# Patient Record
Sex: Female | Born: 1960
Health system: Southern US, Community
[De-identification: ages and names within clinical notes are randomized; demographics above are authoritative.]

## PROBLEM LIST (undated history)

## (undated) DIAGNOSIS — E119 Type 2 diabetes mellitus without complications: Secondary | ICD-10-CM

## (undated) DIAGNOSIS — N200 Calculus of kidney: Secondary | ICD-10-CM

## (undated) DIAGNOSIS — B019 Varicella without complication: Secondary | ICD-10-CM

## (undated) DIAGNOSIS — Z972 Presence of dental prosthetic device (complete) (partial): Secondary | ICD-10-CM

## (undated) HISTORY — DX: Calculus of kidney: N20.0

## (undated) HISTORY — PX: CATARACT EXTRACTION, BILATERAL: SHX1313

## (undated) HISTORY — DX: Varicella without complication: B01.9

## (undated) HISTORY — PX: INNER EAR SURGERY: SHX679

## (undated) HISTORY — DX: Type 2 diabetes mellitus without complications: E11.9

## (undated) HISTORY — PX: BREAST SURGERY: SHX581

---

## 1980-06-01 HISTORY — PX: BREAST EXCISIONAL BIOPSY: SUR124

## 1981-06-01 HISTORY — PX: BREAST BIOPSY: SHX20

## 2005-02-23 ENCOUNTER — Ambulatory Visit: Payer: Self-pay

## 2005-02-26 ENCOUNTER — Other Ambulatory Visit: Payer: Self-pay

## 2005-03-02 ENCOUNTER — Ambulatory Visit: Payer: Self-pay | Admitting: Unknown Physician Specialty

## 2008-04-01 ENCOUNTER — Emergency Department: Payer: Self-pay | Admitting: Emergency Medicine

## 2008-05-29 ENCOUNTER — Encounter: Payer: Self-pay | Admitting: Sports Medicine

## 2008-06-01 ENCOUNTER — Encounter: Payer: Self-pay | Admitting: Sports Medicine

## 2008-07-02 ENCOUNTER — Encounter: Payer: Self-pay | Admitting: Sports Medicine

## 2008-07-31 ENCOUNTER — Ambulatory Visit: Payer: Self-pay | Admitting: General Practice

## 2008-09-03 ENCOUNTER — Ambulatory Visit: Payer: Self-pay | Admitting: Family Medicine

## 2008-09-13 ENCOUNTER — Ambulatory Visit: Payer: Self-pay | Admitting: Family Medicine

## 2008-10-29 ENCOUNTER — Ambulatory Visit: Payer: Self-pay | Admitting: Family Medicine

## 2008-11-02 ENCOUNTER — Ambulatory Visit: Payer: Self-pay | Admitting: Family Medicine

## 2009-11-25 ENCOUNTER — Ambulatory Visit: Payer: Self-pay | Admitting: Family Medicine

## 2010-08-12 ENCOUNTER — Ambulatory Visit: Payer: Self-pay | Admitting: Family Medicine

## 2010-11-27 ENCOUNTER — Ambulatory Visit: Payer: Self-pay | Admitting: Family Medicine

## 2012-05-16 ENCOUNTER — Ambulatory Visit: Payer: Self-pay

## 2012-08-10 ENCOUNTER — Emergency Department: Payer: Self-pay | Admitting: Internal Medicine

## 2012-08-10 LAB — BASIC METABOLIC PANEL
Anion Gap: 4 — ABNORMAL LOW (ref 7–16)
Calcium, Total: 8.4 mg/dL — ABNORMAL LOW (ref 8.5–10.1)
Chloride: 107 mmol/L (ref 98–107)
Co2: 26 mmol/L (ref 21–32)
Creatinine: 0.72 mg/dL (ref 0.60–1.30)
EGFR (Non-African Amer.): >60
Osmolality: 282 (ref 275–301)

## 2012-08-10 LAB — CBC
HGB: 14.5 g/dL (ref 12.0–16.0)
MCH: 30.9 pg (ref 26.0–34.0)
MCHC: 35.3 g/dL (ref 32.0–36.0)
MCV: 88 fL (ref 80–100)
Platelet: 216 10*3/uL (ref 150–440)
RBC: 4.7 10*6/uL (ref 3.80–5.20)
RDW: 12.9 % (ref 11.5–14.5)

## 2012-08-10 LAB — URINALYSIS, COMPLETE
Bilirubin,UR: NEGATIVE
Glucose,UR: >=500 mg/dL (ref 0–75)
Nitrite: NEGATIVE
Protein: NEGATIVE
RBC,UR: 27 /HPF (ref 0–5)
Specific Gravity: 1.023 (ref 1.003–1.030)
Squamous Epithelial: <1

## 2012-08-23 ENCOUNTER — Ambulatory Visit: Payer: Self-pay | Admitting: Urology

## 2012-09-01 ENCOUNTER — Ambulatory Visit: Payer: Self-pay | Admitting: Urology

## 2012-09-15 ENCOUNTER — Ambulatory Visit: Payer: Self-pay | Admitting: Urology

## 2012-11-14 ENCOUNTER — Ambulatory Visit: Payer: Self-pay | Admitting: Gastroenterology

## 2012-12-12 ENCOUNTER — Ambulatory Visit: Payer: Self-pay | Admitting: Family Medicine

## 2012-12-12 LAB — LIPID PANEL
Cholesterol: 85 mg/dL (ref 0–200)
HDL Cholesterol: 58 mg/dL (ref 40–60)
Ldl Cholesterol, Calc: 23 mg/dL (ref 0–100)
VLDL Cholesterol, Calc: 4 mg/dL — ABNORMAL LOW (ref 5–40)

## 2012-12-12 LAB — BASIC METABOLIC PANEL
BUN: 9 mg/dL (ref 7–18)
Chloride: 102 mmol/L (ref 98–107)
Co2: 30 mmol/L (ref 21–32)
Creatinine: 0.63 mg/dL (ref 0.60–1.30)
EGFR (African American): >60
Glucose: 209 mg/dL — ABNORMAL HIGH (ref 65–99)
Osmolality: 281 (ref 275–301)
Sodium: 138 mmol/L (ref 136–145)

## 2016-09-07 ENCOUNTER — Ambulatory Visit: Payer: Self-pay | Admitting: Family

## 2016-09-17 ENCOUNTER — Encounter: Payer: Self-pay | Admitting: Family

## 2016-09-17 ENCOUNTER — Ambulatory Visit (INDEPENDENT_AMBULATORY_CARE_PROVIDER_SITE_OTHER): Payer: 59 | Admitting: Family

## 2016-09-17 VITALS — BP 122/80 | HR 88 | Temp 98.4°F | Ht 61.0 in | Wt 128.0 lb

## 2016-09-17 DIAGNOSIS — Z0000 Encounter for general adult medical examination without abnormal findings: Secondary | ICD-10-CM

## 2016-09-17 DIAGNOSIS — E119 Type 2 diabetes mellitus without complications: Secondary | ICD-10-CM

## 2016-09-17 DIAGNOSIS — E1129 Type 2 diabetes mellitus with other diabetic kidney complication: Secondary | ICD-10-CM | POA: Insufficient documentation

## 2016-09-17 MED ORDER — PNEUMOCOCCAL VAC POLYVALENT 25 MCG/0.5ML IJ INJ
0.5000 mL | INJECTION | Freq: Once | INTRAMUSCULAR | Status: AC
  Administered 2016-09-17: 0.5 mL via INTRAMUSCULAR

## 2016-09-17 NOTE — Patient Instructions (Addendum)
Fasting labs tomorrow.   We placed a referral. Mammogram this year. I asked that you call one the below locations and schedule this when it is convenient for you.   If you have dense breasts, you may ask for 3D mammogram over the traditional 2D mammogram as new evidence suggest 3D is superior. Please note that NOT all insurance companies cover 3D and you may have to pay a higher copay. You may call your insurance company to further clarify your benefits.   Options for [place]  [place], [place]  * [facility] [place], [place] * Note if you ask for 3D mammogram at this location, you must request [place], [place] location*

## 2016-09-17 NOTE — Assessment & Plan Note (Addendum)
Pending a1c. Eye appt scheduled.

## 2016-09-17 NOTE — Assessment & Plan Note (Addendum)
CPE labs; will return for CPE and pap. Mammogram ordered and patient understands to schedule.

## 2016-09-17 NOTE — Progress Notes (Signed)
Pre visit review using our clinic review tool, if applicable. No additional management support is needed unless otherwise documented below in the visit note. 

## 2016-09-17 NOTE — Progress Notes (Signed)
Subjective:    Patient ID: Rachael Weeks, female    DOB: Apr 27, 1961, 56 y.o.   MRN: [id number]  CC: Rachael Weeks is a 56 y.o. female who presents today to establish care.    HPI: Hasn't  had prior PCP.   DM- Controlling with diet. Checks BS at home, averages 150-180. Occasonal tingling from fingers.   Eye appointment scheduled.       HISTORY:  Past Medical History:  Diagnosis Date  . Chicken pox   . Diabetes mellitus without complication ([place])   . Kidney stones    Past Surgical History:  Procedure Laterality Date  . BREAST SURGERY    . INNER EAR SURGERY     Family History  Problem Relation Age of Onset  . Hypertension Mother   . Cancer Father     Prostate  . Hyperlipidemia Father   . Stroke Father   . Hypertension Father   . Diabetes Father   . Diabetes Sister   . Hyperlipidemia Brother   . Stroke Brother   . Hypertension Brother   . Diabetes Brother   . Diabetes Paternal Grandfather   . Breast cancer Paternal Aunt 62  . Colon cancer Neg Hx     Allergies: Patient has no allergy information on record. No current outpatient prescriptions on file prior to visit.   No current facility-administered medications on file prior to visit.     Social History  Substance Use Topics  . Smoking status: Former Smoker    Quit date: 09/08/2008  . Smokeless tobacco: Never Used  . Alcohol use No    Review of Systems  Constitutional: Negative for chills and fever.  Eyes: Negative for visual disturbance.  Respiratory: Negative for cough.   Cardiovascular: Negative for chest pain and palpitations.  Gastrointestinal: Negative for nausea and vomiting.  Endocrine: Negative for polydipsia and polyuria.      Objective:    BP 122/80   Pulse 88   Temp 98.4 F (36.9 C) (Oral)   Ht 5\' 1"  (1.549 m)   Wt 128 lb (58.1 kg)   SpO2 97%   BMI 24.19 kg/m  BP Readings from Last 3 Encounters:  09/17/16 122/80   Wt Readings from Last 3 Encounters:  09/17/16 128 lb (58.1  kg)    Physical Exam  Constitutional: She appears well-developed and well-nourished.  Eyes: Conjunctivae are normal.  Cardiovascular: Normal rate, regular rhythm, normal heart sounds and normal pulses.   Pulmonary/Chest: Effort normal and breath sounds normal. She has no wheezes. She has no rhonchi. She has no rales.  Neurological: She is alert.  Skin: Skin is warm and dry.  Psychiatric: She has a normal mood and affect. Her speech is normal and behavior is normal. Thought content normal.  Vitals reviewed.      Assessment & Plan:   Problem List Items Addressed This Visit      Endocrine   Diabetes mellitus without complication ([place]) - Primary    Pending a1c. Eye appt scheduled.       Relevant Medications   pneumococcal 23 valent vaccine (PNU-IMMUNE) injection 0.5 mL (Completed)   Other Relevant Orders   Hemoglobin A1c (Completed)     Other   Encounter for medical examination to establish care    CPE labs; will return for CPE and pap. Mammogram ordered and patient understands to schedule.       Relevant Orders   CBC with Differential/Platelet (Completed)   Comprehensive metabolic panel (Completed)  Hepatitis C antibody (Completed)   HIV antibody (Completed)   Lipid panel (Completed)   TSH (Completed)   VITAMIN D 25 Hydroxy (Vit-D Deficiency, Fractures) (Completed)   MM DIGITAL SCREENING BILATERAL       We administered pneumococcal 23 valent vaccine.   Meds ordered this encounter  Medications  . pneumococcal 23 valent vaccine (PNU-IMMUNE) injection 0.5 mL    Return precautions given.   Risks, benefits, and alternatives of the medications and treatment plan prescribed today were discussed, and patient expressed understanding.   Education regarding symptom management and diagnosis given to patient on AVS.  Continue to follow with Mable Paris, FNP for routine health maintenance.   Rachael Weeks and I agreed with plan.   Mable Paris, FNP

## 2016-09-18 ENCOUNTER — Other Ambulatory Visit (INDEPENDENT_AMBULATORY_CARE_PROVIDER_SITE_OTHER): Payer: 59

## 2016-09-18 DIAGNOSIS — Z0000 Encounter for general adult medical examination without abnormal findings: Secondary | ICD-10-CM

## 2016-09-18 DIAGNOSIS — E119 Type 2 diabetes mellitus without complications: Secondary | ICD-10-CM | POA: Diagnosis not present

## 2016-09-18 LAB — CBC WITH DIFFERENTIAL/PLATELET
BASOS PCT: 0.4 % (ref 0.0–3.0)
Basophils Absolute: 0 10*3/uL (ref 0.0–0.1)
EOS ABS: 0.1 10*3/uL (ref 0.0–0.7)
EOS PCT: 1.8 % (ref 0.0–5.0)
HEMATOCRIT: 47 % — AB (ref 36.0–46.0)
HEMOGLOBIN: 16.1 g/dL — AB (ref 12.0–15.0)
LYMPHS PCT: 20.8 % (ref 12.0–46.0)
Lymphs Abs: 1.7 10*3/uL (ref 0.7–4.0)
MCHC: 34.2 g/dL (ref 30.0–36.0)
MCV: 87.7 fl (ref 78.0–100.0)
MONOS PCT: 8.9 % (ref 3.0–12.0)
Monocytes Absolute: 0.7 10*3/uL (ref 0.1–1.0)
Neutro Abs: 5.5 10*3/uL (ref 1.4–7.7)
Neutrophils Relative %: 68.1 % (ref 43.0–77.0)
Platelets: 199 10*3/uL (ref 150.0–400.0)
RBC: 5.37 Mil/uL — ABNORMAL HIGH (ref 3.87–5.11)
RDW: 12.6 % (ref 11.5–15.5)
WBC: 8.1 10*3/uL (ref 4.0–10.5)

## 2016-09-18 LAB — COMPREHENSIVE METABOLIC PANEL
ALBUMIN: 4.4 g/dL (ref 3.5–5.2)
ALT: 22 U/L (ref 0–35)
AST: 16 U/L (ref 0–37)
Alkaline Phosphatase: 89 U/L (ref 39–117)
BILIRUBIN TOTAL: 0.8 mg/dL (ref 0.2–1.2)
BUN: 12 mg/dL (ref 6–23)
CALCIUM: 9.3 mg/dL (ref 8.4–10.5)
CHLORIDE: 100 meq/L (ref 96–112)
CO2: 27 mEq/L (ref 19–32)
CREATININE: 0.54 mg/dL (ref 0.40–1.20)
GFR: 124.42 mL/min (ref >60.00)
Glucose, Bld: 255 mg/dL — ABNORMAL HIGH (ref 70–99)
Potassium: 3.9 mEq/L (ref 3.5–5.1)
Sodium: 135 mEq/L (ref 135–145)
Total Protein: 6.9 g/dL (ref 6.0–8.3)

## 2016-09-18 LAB — LIPID PANEL
Cholesterol: 81 mg/dL (ref 0–200)
HDL: 56.4 mg/dL (ref >39.00)
LDL CALC: 21 mg/dL (ref 0–99)
NONHDL: 24.36
Total CHOL/HDL Ratio: 1
Triglycerides: 19 mg/dL (ref 0.0–149.0)
VLDL: 3.8 mg/dL (ref 0.0–40.0)

## 2016-09-18 LAB — VITAMIN D 25 HYDROXY (VIT D DEFICIENCY, FRACTURES): VITD: 17.08 ng/mL — ABNORMAL LOW (ref 30.00–100.00)

## 2016-09-18 LAB — TSH: TSH: 1.22 u[IU]/mL (ref 0.35–4.50)

## 2016-09-18 LAB — HEMOGLOBIN A1C: HEMOGLOBIN A1C: 12 % — AB (ref 4.6–6.5)

## 2016-09-19 LAB — HIV ANTIBODY (ROUTINE TESTING W REFLEX): HIV 1&2 Ab, 4th Generation: NONREACTIVE

## 2016-09-19 LAB — HEPATITIS C ANTIBODY: HCV AB: NEGATIVE

## 2016-09-21 NOTE — Progress Notes (Signed)
Left detailed message for patient to look at [facility].  If patient had questions I instructed patient to return call back.

## 2016-09-22 ENCOUNTER — Encounter: Payer: Self-pay | Admitting: Family

## 2016-09-24 ENCOUNTER — Ambulatory Visit (INDEPENDENT_AMBULATORY_CARE_PROVIDER_SITE_OTHER): Payer: 59 | Admitting: Family

## 2016-09-24 ENCOUNTER — Encounter: Payer: Self-pay | Admitting: Family

## 2016-09-24 VITALS — BP 130/78 | HR 84 | Temp 98.3°F | Ht 61.0 in | Wt 129.2 lb

## 2016-09-24 DIAGNOSIS — E119 Type 2 diabetes mellitus without complications: Secondary | ICD-10-CM | POA: Diagnosis not present

## 2016-09-24 MED ORDER — BD LANCET DEVICE MISC: 1.0000 "application " | Freq: Four times a day (QID) | 3 refills | Status: AC

## 2016-09-24 MED ORDER — GLUCOSE BLOOD VI STRP: 1.0000 | ORAL_STRIP | Freq: Four times a day (QID) | 3 refills | Status: DC

## 2016-09-24 MED ORDER — METFORMIN HCL ER 500 MG PO TB24: ORAL_TABLET | ORAL | 3 refills | Status: DC

## 2016-09-24 NOTE — Assessment & Plan Note (Signed)
Start metformin. Once have blood glucose readings, we will start lantus. Follow up 3 months.

## 2016-09-24 NOTE — Progress Notes (Signed)
Pre visit review using our clinic review tool, if applicable. No additional management support is needed unless otherwise documented below in the visit note. 

## 2016-09-24 NOTE — Progress Notes (Signed)
Subjective:    Patient ID: Rachael Weeks, female    DOB: 07/20/1960, 56 y.o.   MRN: [id number]  CC: Rachael Weeks is a 56 y.o. female who presents today for follow up.   HPI: DM- No medications at this time. Endorses 'some increased urination.' No polyphagia, polydipsia, vision changes. Overall, feels well     HISTORY:  Past Medical History:  Diagnosis Date  . Chicken pox   . Diabetes mellitus without complication ([place])   . Kidney stones    Past Surgical History:  Procedure Laterality Date  . BREAST SURGERY    . INNER EAR SURGERY     Family History  Problem Relation Age of Onset  . Hypertension Mother   . Cancer Father     Prostate  . Hyperlipidemia Father   . Stroke Father   . Hypertension Father   . Diabetes Father   . Diabetes Sister   . Hyperlipidemia Brother   . Stroke Brother   . Hypertension Brother   . Diabetes Brother   . Diabetes Paternal Grandfather   . Breast cancer Paternal Aunt 70  . Colon cancer Neg Hx     Allergies: Patient has no allergy information on record. No current outpatient prescriptions on file prior to visit.   No current facility-administered medications on file prior to visit.     Social History  Substance Use Topics  . Smoking status: Former Smoker    Quit date: 09/08/2008  . Smokeless tobacco: Never Used  . Alcohol use No    Review of Systems  Constitutional: Negative for chills and fever.  Respiratory: Negative for cough.   Cardiovascular: Negative for chest pain and palpitations.  Gastrointestinal: Negative for nausea and vomiting.  Endocrine: Positive for polyuria. Negative for polydipsia and polyphagia.      Objective:    BP 130/78   Pulse 84   Temp 98.3 F (36.8 C) (Oral)   Ht 5\' 1"  (1.549 m)   Wt 129 lb 3.2 oz (58.6 kg)   SpO2 98%   BMI 24.41 kg/m  BP Readings from Last 3 Encounters:  09/24/16 130/78  09/17/16 122/80   Wt Readings from Last 3 Encounters:  09/24/16 129 lb 3.2 oz (58.6 kg)  09/17/16  128 lb (58.1 kg)    Physical Exam  Constitutional: She appears well-developed and well-nourished.  Eyes: Conjunctivae are normal.  Cardiovascular: Normal rate, regular rhythm, normal heart sounds and normal pulses.   Pulmonary/Chest: Effort normal and breath sounds normal. She has no wheezes. She has no rhonchi. She has no rales.  Neurological: She is alert.  Skin: Skin is warm and dry.  Psychiatric: She has a normal mood and affect. Her speech is normal and behavior is normal. Thought content normal.  Vitals reviewed.      Assessment & Plan:   Problem List Items Addressed This Visit      Endocrine   Diabetes mellitus without complication ([place]) - Primary    Start metformin. Once have blood glucose readings, we will start lantus. Follow up 3 months.       Relevant Medications   metFORMIN (GLUCOPHAGE XR) 500 MG 24 hr tablet   glucose blood test strip   Lancet Devices (B-D LANCET DEVICE) MISC       I am having Ms. Schooler start on metFORMIN, glucose blood, and B-D LANCET DEVICE.   Meds ordered this encounter  Medications  . metFORMIN (GLUCOPHAGE XR) 500 MG 24 hr tablet    Sig:  Start 500mg  PO qpm.    Dispense:  90 tablet    Refill:  3    Order Specific Question:   Supervising Provider    Answer:   Deborra Medina L [[id number]]  . glucose blood test strip    Sig: 1 each by Other route 4 (four) times daily. Use to test blood sugar twice daily.    Dispense:  100 each    Refill:  3    Order Specific Question:   Supervising Provider    Answer:   Deborra Medina L [[id number]]  . Lancet Devices (B-D LANCET DEVICE) MISC    Sig: Inject 1 application into the skin 4 (four) times daily.    Dispense:  100 each    Refill:  3    Order Specific Question:   Supervising Provider    Answer:   Crecencio Mc [[id number]]    Return precautions given.   Risks, benefits, and alternatives of the medications and treatment plan prescribed today were discussed, and patient expressed understanding.    Education regarding symptom management and diagnosis given to patient on AVS.  Continue to follow with Mable Paris, FNP for routine health maintenance.   Romie Levee Babinski and I agreed with plan.   Mable Paris, FNP

## 2016-09-24 NOTE — Patient Instructions (Addendum)
Please ensure eye exam yearly  Start metformin 500 mg once daily at night. After one week, may increase to 1000 mg at night once per day.   Will likely get back to 1000mg  twice daily but we will start slow to ensure you tolerate.   Please monitor blood sugars as discussed and send them to me so we can start lantus.    Follow up 3 months

## 2016-10-06 ENCOUNTER — Encounter: Payer: Self-pay | Admitting: Family

## 2016-10-06 DIAGNOSIS — E119 Type 2 diabetes mellitus without complications: Secondary | ICD-10-CM

## 2016-10-08 MED ORDER — INSULIN NPH (HUMAN) (ISOPHANE) 100 UNIT/ML ~~LOC~~ SUSP: 10.0000 [IU] | Freq: Every day | SUBCUTANEOUS | 2 refills | Status: DC

## 2016-10-08 NOTE — Telephone Encounter (Signed)
Call pt  Based on sugars , I have started ONCE daily at bedtime NPH insulin. [facility]  We will start with 10 units  Monitor for signs of hypoglycemia  Please continue blood sugar log -- I would like fasting morning, fasting lunch,  Fasting dinner and bedtime blood sugars so we can ensure on safe dose.   I know that is a lot of sticks for now however I want to get DM under control and then we can cut back on sticks.Marland Kitchen

## 2016-10-15 DIAGNOSIS — E119 Type 2 diabetes mellitus without complications: Secondary | ICD-10-CM | POA: Diagnosis not present

## 2016-10-15 DIAGNOSIS — H524 Presbyopia: Secondary | ICD-10-CM | POA: Diagnosis not present

## 2016-10-15 DIAGNOSIS — H52223 Regular astigmatism, bilateral: Secondary | ICD-10-CM | POA: Diagnosis not present

## 2016-10-15 DIAGNOSIS — H3561 Retinal hemorrhage, right eye: Secondary | ICD-10-CM | POA: Diagnosis not present

## 2016-10-15 DIAGNOSIS — H5213 Myopia, bilateral: Secondary | ICD-10-CM | POA: Diagnosis not present

## 2016-10-21 NOTE — Telephone Encounter (Signed)
Was pt called with information in prior note 5/10 to brock  Cannot tell and want to ensure we addressed

## 2016-11-02 ENCOUNTER — Ambulatory Visit
Admission: RE | Admit: 2016-11-02 | Discharge: 2016-11-02 | Disposition: A | Discharge status: Home / Self Care | Payer: 59 | Source: Ambulatory Visit | Attending: Family | Admitting: Family

## 2016-11-02 DIAGNOSIS — Z1231 Encounter for screening mammogram for malignant neoplasm of breast: Secondary | ICD-10-CM | POA: Diagnosis not present

## 2016-11-02 DIAGNOSIS — Z0000 Encounter for general adult medical examination without abnormal findings: Secondary | ICD-10-CM

## 2016-11-09 LAB — HM DIABETES EYE EXAM

## 2016-12-17 ENCOUNTER — Other Ambulatory Visit (HOSPITAL_COMMUNITY)
Admission: RE | Admit: 2016-12-17 | Discharge: 2016-12-17 | Disposition: A | Discharge status: Home / Self Care | Payer: 59 | Source: Ambulatory Visit | Attending: Family | Admitting: Family

## 2016-12-17 ENCOUNTER — Ambulatory Visit (INDEPENDENT_AMBULATORY_CARE_PROVIDER_SITE_OTHER): Payer: 59 | Admitting: Family

## 2016-12-17 ENCOUNTER — Encounter: Payer: Self-pay | Admitting: Family

## 2016-12-17 VITALS — BP 120/70 | HR 71 | Temp 98.1°F | Ht 61.0 in | Wt 127.4 lb

## 2016-12-17 DIAGNOSIS — Z0000 Encounter for general adult medical examination without abnormal findings: Secondary | ICD-10-CM | POA: Insufficient documentation

## 2016-12-17 DIAGNOSIS — R229 Localized swelling, mass and lump, unspecified: Secondary | ICD-10-CM

## 2016-12-17 DIAGNOSIS — R21 Rash and other nonspecific skin eruption: Secondary | ICD-10-CM | POA: Insufficient documentation

## 2016-12-17 DIAGNOSIS — L4050 Arthropathic psoriasis, unspecified: Secondary | ICD-10-CM | POA: Insufficient documentation

## 2016-12-17 DIAGNOSIS — R079 Chest pain, unspecified: Secondary | ICD-10-CM | POA: Insufficient documentation

## 2016-12-17 DIAGNOSIS — E119 Type 2 diabetes mellitus without complications: Secondary | ICD-10-CM | POA: Diagnosis not present

## 2016-12-17 DIAGNOSIS — IMO0002 Reserved for concepts with insufficient information to code with codable children: Secondary | ICD-10-CM | POA: Insufficient documentation

## 2016-12-17 LAB — POCT GLYCOSYLATED HEMOGLOBIN (HGB A1C): HEMOGLOBIN A1C: 10.2

## 2016-12-17 NOTE — Addendum Note (Signed)
Addended by: Elpidio Galea T on: 12/17/2016 09:52 AM   Modules accepted: Orders

## 2016-12-17 NOTE — Assessment & Plan Note (Signed)
Appearance consistent with psoriasis. Referral to rheum.

## 2016-12-17 NOTE — Assessment & Plan Note (Signed)
CBE and pap performed. Screening labs prior. Encouraged exercise.

## 2016-12-17 NOTE — Progress Notes (Signed)
Pre visit review using our clinic review tool, if applicable. No additional management support is needed unless otherwise documented below in the visit note. 

## 2016-12-17 NOTE — Assessment & Plan Note (Signed)
Right low back soft tissue mass. Nonfluctuant and no evidence of infection. Suspect lipoma. Pending ultrasound

## 2016-12-17 NOTE — Assessment & Plan Note (Signed)
Compared to EKG 2006 and no acute changes. No ischemia seen today. Due to symptom which is very concerning, referral to cardiology for further evaluation particularly in context of family history of heart disease. She understands to go to ED if any more chest pain.

## 2016-12-17 NOTE — Progress Notes (Signed)
Subjective:    Patient ID: Rachael Weeks, female    DOB: [birth year], 56 y.o.   MRN: [id number]  CC: Rachael Weeks is a 56 y.o. female who presents today for physical exam.    HPI: DM- fasting around 220s. Compliant with metformin.   Knot right low back. Nontender. Hasn't changed in size. 6 months. No purulent discharge, injury. No back pain.   Complains of chest pain, 2-3 episodes 3 months. Endorses diaphoresis during one episode. Lasted 5 minutes. Occurs at rest. Denies numbness or tingling radiating to left arm or jaw, palpitations, dizziness, frequent headaches, changes in vision, or shortness of breath.      Colorectal Cancer Screening: UTD , 2013. Normal. Told to return in 10 years. Dr Allen Norris. Breast Cancer Screening: Mammogram UTD, BI RAD 1. Cervical Cancer Screening: due Bone Health screening/DEXA for 65+: No increased fracture risk. Defer screening at this time. Lung Cancer Screening: Doesn't have 30 year pack year history. Quit 9 years ago. Smoked for 15 years.        Tetanus - utd        Pneumococcal - Completed 23.  Labs: Screening labs done prior.  Exercise: Gets regular exercise.  Alcohol use: None Smoking/tobacco use: former smoker.  Regular dental exams: In need of dental exam. Wears seat belt: Yes. Skin: no skin cancer history; suspect psoriasis on elbows, comes and goes. No joint pain.   HISTORY:  Past Medical History:  Diagnosis Date  . Chicken pox   . Diabetes mellitus without complication ([place])   . Kidney stones     Past Surgical History:  Procedure Laterality Date  . BREAST BIOPSY Right 1983   benign  . BREAST SURGERY    . INNER EAR SURGERY     Family History  Problem Relation Age of Onset  . Hypertension Mother   . Cancer Father        Prostate  . Hyperlipidemia Father   . Stroke Father   . Hypertension Father   . Diabetes Father   . Diabetes Sister   . Hyperlipidemia Brother   . Stroke Brother   . Hypertension Brother   . Diabetes  Brother   . Diabetes Paternal Grandfather   . Breast cancer Paternal Aunt 64  . Colon cancer Neg Hx       ALLERGIES: Patient has no allergy information on record.  Current Outpatient Prescriptions on File Prior to Visit  Medication Sig Dispense Refill  . glucose blood test strip 1 each by Other route 4 (four) times daily. Use to test blood sugar twice daily. 100 each 3  . Lancet Devices (B-D LANCET DEVICE) MISC Inject 1 application into the skin 4 (four) times daily. 100 each 3  . metFORMIN (GLUCOPHAGE XR) 500 MG 24 hr tablet Start 500mg  PO qpm. 90 tablet 3   No current facility-administered medications on file prior to visit.     Social History  Substance Use Topics  . Smoking status: Former Smoker    Quit date: 09/08/2008  . Smokeless tobacco: Never Used  . Alcohol use No    Review of Systems  Constitutional: Negative for chills, fever and unexpected weight change.  HENT: Negative for congestion.   Respiratory: Negative for cough and shortness of breath.   Cardiovascular: Positive for chest pain. Negative for palpitations and leg swelling.  Gastrointestinal: Negative for nausea and vomiting.  Musculoskeletal: Negative for arthralgias and myalgias.  Skin: Positive for rash.  Neurological: Negative for headaches.  Hematological:  Negative for adenopathy.  Psychiatric/Behavioral: Negative for confusion.      Objective:    BP 120/70   Pulse 71   Temp 98.1 F (36.7 C) (Oral)   Ht 5\' 1"  (1.549 m)   Wt 127 lb 6.4 oz (57.8 kg)   SpO2 97%   BMI 24.07 kg/m   BP Readings from Last 3 Encounters:  12/17/16 120/70  09/24/16 130/78  09/17/16 122/80   Wt Readings from Last 3 Encounters:  12/17/16 127 lb 6.4 oz (57.8 kg)  09/24/16 129 lb 3.2 oz (58.6 kg)  09/17/16 128 lb (58.1 kg)    Physical Exam  Constitutional: She appears well-developed and well-nourished.  Eyes: Conjunctivae are normal.  Neck: No thyroid mass and no thyromegaly present.  Cardiovascular: Normal  rate, regular rhythm, normal heart sounds and normal pulses.   Pulmonary/Chest: Effort normal and breath sounds normal. She has no wheezes. She has no rhonchi. She has no rales. Right breast exhibits no inverted nipple, no mass, no nipple discharge, no skin change and no tenderness. Left breast exhibits no inverted nipple, no mass, no nipple discharge, no skin change and no tenderness. Breasts are symmetrical.  No masses or asymmetry appreciated during CBE.  Genitourinary: Uterus is not enlarged, not fixed and not tender. Cervix exhibits no motion tenderness, no discharge and no friability. Right adnexum displays no mass, no tenderness and no fullness. Left adnexum displays no mass, no tenderness and no fullness.  Genitourinary Comments: Pap performed. No CMT. Unable to appreciated ovaries.  Lymphadenopathy:       Head (right side): No submental, no submandibular, no tonsillar, no preauricular, no posterior auricular and no occipital adenopathy present.       Head (left side): No submental, no submandibular, no tonsillar, no preauricular, no posterior auricular and no occipital adenopathy present.       Right cervical: No superficial cervical, no deep cervical and no posterior cervical adenopathy present.      Left cervical: No superficial cervical, no deep cervical and no posterior cervical adenopathy present.    She has no axillary adenopathy.       Right axillary: No pectoral and no lateral adenopathy present.       Left axillary: No pectoral and no lateral adenopathy present. Neurological: She is alert.  Skin: Skin is warm and dry. Lesion noted.     Scaly lesions noted bilateral elbows. No purulent discharge, increase in warmth. No elbow pain.  Well-circumscribed nontender mass approximately 2-3 cm in diameter noted right mid back. Nontender and nonfluctuant.No surrounding erythema or purulent discharge  Psychiatric: She has a normal mood and affect. Her speech is normal and behavior is  normal. Thought content normal.  Vitals reviewed.      Assessment & Plan:   Problem List Items Addressed This Visit      Endocrine   Diabetes mellitus without complication ([facility])    Improved. 10.2. Referral to nutrition. Increase in metformin.       Relevant Orders   POCT HgB A1C (Completed)   Referral to Nutrition and Diabetes Services     Musculoskeletal and Integument   Rash    Appearance consistent with psoriasis. Referral to rheum.       Relevant Orders   Ambulatory referral to Rheumatology     Other   Routine physical examination - Primary    CBE and pap performed. Screening labs prior. Encouraged exercise.       Chest pain    Compared to EKG 2006  and no acute changes. No ischemia seen today. Due to symptom which is very concerning, referral to cardiology for further evaluation particularly in context of family history of heart disease. She understands to go to ED if any more chest pain.       Relevant Orders   Ambulatory referral to Cardiology   EKG 12-Lead (Completed)   Mass    Right low back soft tissue mass. Nonfluctuant and no evidence of infection. Suspect lipoma. Pending ultrasound      Relevant Orders   [place] Misc Soft Tissue       I have discontinued Ms. Stoltzfus's insulin NPH Human. I am also having her maintain her metFORMIN, glucose blood, and B-D LANCET DEVICE.   No orders of the defined types were placed in this encounter.   Return precautions given.   Risks, benefits, and alternatives of the medications and treatment plan prescribed today were discussed, and patient expressed understanding.   Education regarding symptom management and diagnosis given to patient on AVS.   Continue to follow with Burnard Hawthorne, FNP for routine health maintenance.   Romie Levee Zappia and I agreed with plan.   Mable Paris, FNP

## 2016-12-17 NOTE — Patient Instructions (Addendum)
Increase metformin XR as discussed and as tolerated '500mg'$  total per week. Max daily daily dose of '2000mg'$  XR per day.    Any episodes of chest pain, please go to ED  Health Maintenance, Female Adopting a healthy lifestyle and getting preventive care can go a long way to promote health and wellness. Talk with your health care provider about what schedule of regular examinations is right for you. This is a good chance for you to check in with your provider about disease prevention and staying healthy. In between checkups, there are plenty of things you can do on your own. Experts have done a lot of research about which lifestyle changes and preventive measures are most likely to keep you healthy. Ask your health care provider for more information. Weight and diet Eat a healthy diet  Be sure to include plenty of vegetables, fruits, low-fat dairy products, and lean protein.  Do not eat a lot of foods high in solid fats, added sugars, or salt.  Get regular exercise. This is one of the most important things you can do for your health. ? Most adults should exercise for at least 150 minutes each week. The exercise should increase your heart rate and make you sweat (moderate-intensity exercise). ? Most adults should also do strengthening exercises at least twice a week. This is in addition to the moderate-intensity exercise.  Maintain a healthy weight  Body mass index (BMI) is a measurement that can be used to identify possible weight problems. It estimates body fat based on height and weight. Your health care provider can help determine your BMI and help you achieve or maintain a healthy weight.  For females 4 years of age and older: ? A BMI below 18.5 is considered underweight. ? A BMI of 18.5 to 24.9 is normal. ? A BMI of 25 to 29.9 is considered overweight. ? A BMI of 30 and above is considered obese.  Watch levels of cholesterol and blood lipids  You should start having your blood tested  for lipids and cholesterol at 56 years of age, then have this test every 5 years.  You may need to have your cholesterol levels checked more often if: ? Your lipid or cholesterol levels are high. ? You are older than 56 years of age. ? You are at high risk for heart disease.  Cancer screening Lung Cancer  Lung cancer screening is recommended for adults 37-24 years old who are at high risk for lung cancer because of a history of smoking.  A yearly low-dose CT scan of the lungs is recommended for people who: ? Currently smoke. ? Have quit within the past 15 years. ? Have at least a 30-pack-year history of smoking. A pack year is smoking an average of one pack of cigarettes a day for 1 year.  Yearly screening should continue until it has been 15 years since you quit.  Yearly screening should stop if you develop a health problem that would prevent you from having lung cancer treatment.  Breast Cancer  Practice breast self-awareness. This means understanding how your breasts normally appear and feel.  It also means doing regular breast self-exams. Let your health care provider know about any changes, no matter how small.  If you are in your 20s or 30s, you should have a clinical breast exam (CBE) by a health care provider every 1-3 years as part of a regular health exam.  If you are 43 or older, have a CBE every year. Also  consider having a breast X-ray (mammogram) every year.  If you have a family history of breast cancer, talk to your health care provider about genetic screening.  If you are at high risk for breast cancer, talk to your health care provider about having an MRI and a mammogram every year.  Breast cancer gene (BRCA) assessment is recommended for women who have family members with BRCA-related cancers. BRCA-related cancers include: ? Breast. ? Ovarian. ? Tubal. ? Peritoneal cancers.  Results of the assessment will determine the need for genetic counseling and BRCA1  and BRCA2 testing.  Cervical Cancer Your health care provider may recommend that you be screened regularly for cancer of the pelvic organs (ovaries, uterus, and vagina). This screening involves a pelvic examination, including checking for microscopic changes to the surface of your cervix (Pap test). You may be encouraged to have this screening done every 3 years, beginning at age 11.  For women ages 1-65, health care providers may recommend pelvic exams and Pap testing every 3 years, or they may recommend the Pap and pelvic exam, combined with testing for human papilloma virus (HPV), every 5 years. Some types of HPV increase your risk of cervical cancer. Testing for HPV may also be done on women of any age with unclear Pap test results.  Other health care providers may not recommend any screening for nonpregnant women who are considered low risk for pelvic cancer and who do not have symptoms. Ask your health care provider if a screening pelvic exam is right for you.  If you have had past treatment for cervical cancer or a condition that could lead to cancer, you need Pap tests and screening for cancer for at least 20 years after your treatment. If Pap tests have been discontinued, your risk factors (such as having a new sexual partner) need to be reassessed to determine if screening should resume. Some women have medical problems that increase the chance of getting cervical cancer. In these cases, your health care provider may recommend more frequent screening and Pap tests.  Colorectal Cancer  This type of cancer can be detected and often prevented.  Routine colorectal cancer screening usually begins at 56 years of age and continues through 56 years of age.  Your health care provider may recommend screening at an earlier age if you have risk factors for colon cancer.  Your health care provider may also recommend using home test kits to check for hidden blood in the stool.  A small camera at  the end of a tube can be used to examine your colon directly (sigmoidoscopy or colonoscopy). This is done to check for the earliest forms of colorectal cancer.  Routine screening usually begins at age 46.  Direct examination of the colon should be repeated every 5-10 years through 56 years of age. However, you may need to be screened more often if early forms of precancerous polyps or small growths are found.  Skin Cancer  Check your skin from head to toe regularly.  Tell your health care provider about any new moles or changes in moles, especially if there is a change in a mole's shape or color.  Also tell your health care provider if you have a mole that is larger than the size of a pencil eraser.  Always use sunscreen. Apply sunscreen liberally and repeatedly throughout the day.  Protect yourself by wearing long sleeves, pants, a wide-brimmed hat, and sunglasses whenever you are outside.  Heart disease, diabetes, and high blood  pressure  High blood pressure causes heart disease and increases the risk of stroke. High blood pressure is more likely to develop in: ? People who have blood pressure in the high end of the normal range (130-139/85-89 mm Hg). ? People who are overweight or obese. ? People who are African American.  If you are 18-39 years of age, have your blood pressure checked every 3-5 years. If you are 40 years of age or older, have your blood pressure checked every year. You should have your blood pressure measured twice-once when you are at a hospital or clinic, and once when you are not at a hospital or clinic. Record the average of the two measurements. To check your blood pressure when you are not at a hospital or clinic, you can use: ? An automated blood pressure machine at a pharmacy. ? A home blood pressure monitor.  If you are between 55 years and 79 years old, ask your health care provider if you should take aspirin to prevent strokes.  Have regular diabetes  screenings. This involves taking a blood sample to check your fasting blood sugar level. ? If you are at a normal weight and have a low risk for diabetes, have this test once every three years after 56 years of age. ? If you are overweight and have a high risk for diabetes, consider being tested at a younger age or more often. Preventing infection Hepatitis B  If you have a higher risk for hepatitis B, you should be screened for this virus. You are considered at high risk for hepatitis B if: ? You were born in a country where hepatitis B is common. Ask your health care provider which countries are considered high risk. ? Your parents were born in a high-risk country, and you have not been immunized against hepatitis B (hepatitis B vaccine). ? You have HIV or AIDS. ? You use needles to inject street drugs. ? You live with someone who has hepatitis B. ? You have had sex with someone who has hepatitis B. ? You get hemodialysis treatment. ? You take certain medicines for conditions, including cancer, organ transplantation, and autoimmune conditions.  Hepatitis C  Blood testing is recommended for: ? Everyone born from 1945 through 1965. ? Anyone with known risk factors for hepatitis C.  Sexually transmitted infections (STIs)  You should be screened for sexually transmitted infections (STIs) including gonorrhea and chlamydia if: ? You are sexually active and are younger than 56 years of age. ? You are older than 56 years of age and your health care provider tells you that you are at risk for this type of infection. ? Your sexual activity has changed since you were last screened and you are at an increased risk for chlamydia or gonorrhea. Ask your health care provider if you are at risk.  If you do not have HIV, but are at risk, it may be recommended that you take a prescription medicine daily to prevent HIV infection. This is called pre-exposure prophylaxis (PrEP). You are considered at risk  if: ? You are sexually active and do not regularly use condoms or know the HIV status of your partner(s). ? You take drugs by injection. ? You are sexually active with a partner who has HIV.  Talk with your health care provider about whether you are at high risk of being infected with HIV. If you choose to begin PrEP, you should first be tested for HIV. You should then be tested every   3 months for as long as you are taking PrEP. Pregnancy  If you are premenopausal and you may become pregnant, ask your health care provider about preconception counseling.  If you may become pregnant, take 400 to 800 micrograms (mcg) of folic acid every day.  If you want to prevent pregnancy, talk to your health care provider about birth control (contraception). Osteoporosis and menopause  Osteoporosis is a disease in which the bones lose minerals and strength with aging. This can result in serious bone fractures. Your risk for osteoporosis can be identified using a bone density scan.  If you are 90 years of age or older, or if you are at risk for osteoporosis and fractures, ask your health care provider if you should be screened.  Ask your health care provider whether you should take a calcium or vitamin D supplement to lower your risk for osteoporosis.  Menopause may have certain physical symptoms and risks.  Hormone replacement therapy may reduce some of these symptoms and risks. Talk to your health care provider about whether hormone replacement therapy is right for you. Follow these instructions at home:  Schedule regular health, dental, and eye exams.  Stay current with your immunizations.  Do not use any tobacco products including cigarettes, chewing tobacco, or electronic cigarettes.  If you are pregnant, do not drink alcohol.  If you are breastfeeding, limit how much and how often you drink alcohol.  Limit alcohol intake to no more than 1 drink per day for nonpregnant women. One drink  equals 12 ounces of beer, 5 ounces of wine, or 1 ounces of hard liquor.  Do not use street drugs.  Do not share needles.  Ask your health care provider for help if you need support or information about quitting drugs.  Tell your health care provider if you often feel depressed.  Tell your health care provider if you have ever been abused or do not feel safe at home. This information is not intended to replace advice given to you by your health care provider. Make sure you discuss any questions you have with your health care provider. Document Released: 12/01/2010 Document Revised: 10/24/2015 Document Reviewed: 02/19/2015 Elsevier Interactive Patient Education  [employer].

## 2016-12-17 NOTE — Assessment & Plan Note (Signed)
Improved. 10.2. Referral to nutrition. Increase in metformin.

## 2016-12-18 ENCOUNTER — Other Ambulatory Visit: Payer: Self-pay | Admitting: Family

## 2016-12-18 LAB — CYTOLOGY - PAP
Diagnosis: NEGATIVE
HPV: NOT DETECTED

## 2016-12-21 ENCOUNTER — Other Ambulatory Visit: Payer: Self-pay | Admitting: Family

## 2016-12-21 DIAGNOSIS — M7989 Other specified soft tissue disorders: Secondary | ICD-10-CM

## 2016-12-23 ENCOUNTER — Ambulatory Visit
Admission: RE | Admit: 2016-12-23 | Discharge: 2016-12-23 | Disposition: A | Discharge status: Home / Self Care | Payer: 59 | Source: Ambulatory Visit | Attending: Family | Admitting: Family

## 2016-12-23 ENCOUNTER — Ambulatory Visit: Admission: RE | Admit: 2016-12-23 | Payer: 59 | Source: Ambulatory Visit

## 2016-12-23 DIAGNOSIS — M799 Soft tissue disorder, unspecified: Secondary | ICD-10-CM | POA: Diagnosis not present

## 2016-12-23 DIAGNOSIS — M7989 Other specified soft tissue disorders: Secondary | ICD-10-CM

## 2016-12-23 DIAGNOSIS — R1900 Intra-abdominal and pelvic swelling, mass and lump, unspecified site: Secondary | ICD-10-CM | POA: Diagnosis not present

## 2016-12-29 ENCOUNTER — Encounter: Payer: Self-pay | Admitting: Family

## 2016-12-29 ENCOUNTER — Encounter: Payer: Self-pay | Admitting: Cardiovascular Disease

## 2016-12-29 ENCOUNTER — Ambulatory Visit (INDEPENDENT_AMBULATORY_CARE_PROVIDER_SITE_OTHER): Payer: 59 | Admitting: Cardiovascular Disease

## 2016-12-29 VITALS — BP 118/70 | HR 75 | Ht 61.0 in | Wt 124.5 lb

## 2016-12-29 DIAGNOSIS — R079 Chest pain, unspecified: Secondary | ICD-10-CM

## 2016-12-29 DIAGNOSIS — E1165 Type 2 diabetes mellitus with hyperglycemia: Secondary | ICD-10-CM

## 2016-12-29 DIAGNOSIS — Z87891 Personal history of nicotine dependence: Secondary | ICD-10-CM

## 2016-12-29 DIAGNOSIS — R0602 Shortness of breath: Secondary | ICD-10-CM
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
## 2016-12-29 NOTE — Patient Instructions (Addendum)
Medication Instructions:   No medication changes made  Start vit D daily, at least 3,000 units  Labwork:  No new labs needed  Testing/Procedures:  We will order a CT coronary calcium score There is a one-time fee of $150 due at the time of your procedure Please call [phone] to schedule this at your convenience [address] [place], [place] [address], [place]: It was a pleasure seeing you in the office today. Please call us if you have new issues that need to be addressed before your next appt.  [phone]  Your physician wants you to follow-up in:  as needed  If you need a refill on your cardiac medications before your next appointment, please call your pharmacy.     Coronary Calcium Scan A coronary calcium scan is an imaging test used to look for deposits of calcium and other fatty materials (plaques) in the inner lining of the blood vessels of the heart (coronary arteries). These deposits of calcium and plaques can partly clog and narrow the coronary arteries without producing any symptoms or warning signs. This puts a person at risk for a heart attack. This test can detect these deposits before symptoms develop. Tell a health care provider about:  Any allergies you have.  All medicines you are taking, including vitamins, herbs, eye drops, creams, and over-the-counter medicines.  Any problems you or family members have had with anesthetic medicines.  Any blood disorders you have.  Any surgeries you have had.  Any medical conditions you have.  Whether you are pregnant or may be pregnant. What are the risks? Generally, this is a safe procedure. However, problems may occur, including:  Harm to a pregnant woman and her unborn baby. This test involves the use of radiation. Radiation exposure can be dangerous to a pregnant woman and her unborn baby. If you are pregnant, you generally should not have this procedure done.  Slight increase in the risk of  cancer. This is because of the radiation involved in the test.  What happens before the procedure? No preparation is needed for this procedure. What happens during the procedure?  You will undress and remove any jewelry around your neck or chest.  You will put on a hospital gown.  Sticky electrodes will be placed on your chest. The electrodes will be connected to an electrocardiogram (ECG) machine to record a tracing of the electrical activity of your heart.  A CT scanner will take pictures of your heart. During this time, you will be asked to lie still and hold your breath for 2-3 seconds while a picture of your heart is being taken. The procedure may vary among health care providers and hospitals. What happens after the procedure?  You can get dressed.  You can return to your normal activities.  It is up to you to get the results of your test. Ask your health care provider, or the department that is doing the test, when your results will be ready. Summary  A coronary calcium scan is an imaging test used to look for deposits of calcium and other fatty materials (plaques) in the inner lining of the blood vessels of the heart (coronary arteries).  Generally, this is a safe procedure. Tell your health care provider if you are pregnant or may be pregnant.  No preparation is needed for this procedure.  A CT scanner will take pictures of your heart.  You can return to your normal activities after the scan is done. This information is  not intended to replace advice given to you by your health care provider. Make sure you discuss any questions you have with your health care provider. Document Released: 11/14/2007 Document Revised: 04/06/2016 Document Reviewed: 04/06/2016 Elsevier Interactive Patient Education  2017 [employer].

## 2016-12-31 ENCOUNTER — Other Ambulatory Visit: Payer: Self-pay | Admitting: Family

## 2016-12-31 DIAGNOSIS — N952 Postmenopausal atrophic vaginitis: Secondary | ICD-10-CM

## 2016-12-31 MED ORDER — ESTRADIOL 0.1 MG/GM VA CREA: TOPICAL_CREAM | VAGINAL | 0 refills | Status: DC

## 2017-01-01 ENCOUNTER — Ambulatory Visit (INDEPENDENT_AMBULATORY_CARE_PROVIDER_SITE_OTHER)
Admission: RE | Admit: 2017-01-01 | Discharge: 2017-01-01 | Disposition: A | Discharge status: Home / Self Care | Payer: Self-pay | Source: Ambulatory Visit | Attending: Cardiovascular Disease | Admitting: Cardiovascular Disease

## 2017-01-01 ENCOUNTER — Encounter: Payer: 59 | Attending: Family | Admitting: *Deleted

## 2017-01-01 ENCOUNTER — Encounter: Payer: Self-pay | Admitting: *Deleted

## 2017-01-01 VITALS — BP 110/74 | Ht 60.0 in | Wt 125.3 lb

## 2017-01-01 DIAGNOSIS — R079 Chest pain, unspecified: Secondary | ICD-10-CM

## 2017-01-01 DIAGNOSIS — E119 Type 2 diabetes mellitus without complications: Secondary | ICD-10-CM | POA: Diagnosis not present

## 2017-01-01 DIAGNOSIS — Z713 Dietary counseling and surveillance: Secondary | ICD-10-CM | POA: Diagnosis not present

## 2017-01-01 DIAGNOSIS — E1165 Type 2 diabetes mellitus with hyperglycemia: Secondary | ICD-10-CM

## 2017-01-01 NOTE — Patient Instructions (Addendum)
Check blood sugars 1-2 x day before breakfast or 2 hrs after supper every day Bring blood sugar records to the next appointment  Exercise: Continue walking for 10  minutes   2 days a week and gradually increase to 30 minutes 5 x week  Eat 3 meals day,  1-2 snacks a day Space meals 4-6 hours apart Limit fried foods Complete 3 Day Food Record and bring to next appt  Return for appointments on:  Tuesday January 12, 2017 at 9:00 with Jeannene Patella

## 2017-01-01 NOTE — Progress Notes (Signed)
Diabetes Self-Management Education  Visit Type: First/Initial  Appt. Start Time: 0900 Appt. End Time: 1027  01/01/2017  [name], identified by name and date of birth, is a 56 y.o. female with a diagnosis of Diabetes: Type 2.   ASSESSMENT  Blood pressure 110/74, height 5' (1.524 m), weight 125 lb 4.8 oz (56.8 kg). Body mass index is 24.47 kg/m.      Diabetes Self-Management Education - 01/01/17 1038      Visit Information   Visit Type First/Initial     Initial Visit   Diabetes Type Type 2   Are you currently following a meal plan? Yes   What type of meal plan do you follow? "less fried foods and less potatoes"   Are you taking your medications as prescribed? Yes   Date Diagnosed 8 years ago     Health Coping   How would you rate your overall health? Fair     Psychosocial Assessment   Patient Belief/Attitude about Diabetes Motivated to manage diabetes   Self-care barriers None   Self-management support Doctor's office;Family   Patient Concerns Nutrition/Meal planning;Glycemic Control;Monitoring;Healthy Lifestyle   Special Needs None   Preferred Learning Style Visual;Auditory   Learning Readiness Change in progress   How often do you need to have someone help you when you read instructions, pamphlets, or other written materials from your doctor or pharmacy? 1 - Never   What is the last grade level you completed in school? [employer]     Pre-Education Assessment   Patient understands the diabetes disease and treatment process. Needs Review   Patient understands incorporating nutritional management into lifestyle. Needs Review   Patient undertands incorporating physical activity into lifestyle. Needs Review   Patient understands using medications safely. Needs Review   Patient understands monitoring blood glucose, interpreting and using results Needs Review   Patient understands prevention, detection, and treatment of acute complications. Needs Instruction   Patient understands prevention, detection, and treatment of chronic complications. Needs Review   Patient understands how to develop strategies to address psychosocial issues. Needs Review   Patient understands how to develop strategies to promote health/change behavior. Needs Review     Complications   Last HgB A1C per patient/outside source 10.2 %  12/17/16   How often do you check your blood sugar? 1-2 times/day   Fasting Blood glucose range (mg/dL) >200  Pt reports FBG's range from 220-230's mg/dL.    Have you had a dilated eye exam in the past 12 months? Yes   Have you had a dental exam in the past 12 months? No   Are you checking your feet? Yes   How many days per week are you checking your feet? 7     Dietary Intake   Breakfast cereal and milk; bacon, egg and cheese biscuit with hashbrown   Lunch eats out 4 days [place], [place], [employer]; salads, sandwich, fruit   Snack (afternoon) peanut butter crackers; snack mix   Dinner sandwich - ham, cheese or egg; grilled pork or chicken or beef, zucchini, carrots, tomatoes, beans, peas, corn, potatoes   Beverage(s) water, coffee - black, diet soda     Exercise   Exercise Type Light (walking / raking leaves)   How many days per week to you exercise? 2   How many minutes per day do you exercise? 10   Total minutes per week of exercise 20     Patient Education   Previous Diabetes Education Yes (please comment)  8 years  ago - came to [facility] site for classes   Disease state  Definition of diabetes, type 1 and 2, and the diagnosis of diabetes;Explored patient's options for treatment of their diabetes   Nutrition management  Role of diet in the treatment of diabetes and the relationship between the three main macronutrients and blood glucose level;Food label reading, portion sizes and measuring food.;Reviewed blood glucose goals for pre and post meals and how to evaluate the patients' food intake on their blood glucose level.;Meal timing in  regards to the patients' current diabetes medication.   Physical activity and exercise  Role of exercise on diabetes management, blood pressure control and cardiac health.   Medications Reviewed patients medication for diabetes, action, purpose, timing of dose and side effects.   Monitoring Purpose and frequency of SMBG.;Taught/discussed recording of test results and interpretation of SMBG.;Identified appropriate SMBG and/or A1C goals.   Chronic complications Relationship between chronic complications and blood glucose control   Psychosocial adjustment Identified and addressed patients feelings and concerns about diabetes     Individualized Goals (developed by patient)   Reducing Risk Improve blood sugars Prevent diabetes complications Lead a healthier lifestyle Become more fit     Outcomes   Expected Outcomes Demonstrated interest in learning. Expect positive outcomes   Future DMSE 2 wks      Individualized Plan for Diabetes Self-Management Training:   Learning Objective:  Patient will have a greater understanding of diabetes self-management. Patient education plan is to attend individual and/or group sessions per assessed needs and concerns.   Plan:   Patient Instructions  Check blood sugars 1-2 x day before breakfast or 2 hrs after supper every day Bring blood sugar records to the next appointment Exercise: Continue walking for 10  minutes   2 days a week and gradually increase to 30 minutes 5 x week Eat 3 meals day,  1-2 snacks a day Space meals 4-6 hours apart Limit fried foods Complete 3 Day Food Record and bring to next appt Return for appointments on:  Tuesday January 12, 2017 at 9:00 with [facility] (dietitian)  Expected Outcomes:  Demonstrated interest in learning. Expect positive outcomes  Education material provided:  General Meal Planning Guidelines Simple Meal Plan 3 Day Food Record  If problems or questions, patient to contact team via:  Johny Drilling, RN, [place], CDE  [phone]  Future DSME appointment: 2 wks  January 12, 2017 with the dietitian

## 2017-01-03 ENCOUNTER — Telehealth: Payer: Self-pay | Admitting: Family

## 2017-01-03 NOTE — Telephone Encounter (Signed)
Call pt Reviewed nutrionist note  Her fasting blood sugars are pretty high  Can she come in for a sooner appt?   May need to start another medicaiton in addition to metformin

## 2017-01-04 NOTE — Telephone Encounter (Signed)
Scheduled patient for Thursday @ 0830

## 2017-01-07 ENCOUNTER — Ambulatory Visit (INDEPENDENT_AMBULATORY_CARE_PROVIDER_SITE_OTHER): Payer: 59 | Admitting: Family

## 2017-01-07 ENCOUNTER — Encounter: Payer: Self-pay | Admitting: Family

## 2017-01-07 VITALS — BP 132/74 | HR 85 | Temp 98.1°F | Ht 60.0 in | Wt 126.6 lb

## 2017-01-07 DIAGNOSIS — E119 Type 2 diabetes mellitus without complications: Secondary | ICD-10-CM

## 2017-01-07 MED ORDER — GLIPIZIDE 5 MG PO TABS: 5.0000 mg | ORAL_TABLET | Freq: Every day | ORAL | 1 refills | Status: DC

## 2017-01-07 NOTE — Patient Instructions (Addendum)
start glipizide 5 mg once per day. May increase to 5mg  twice per day if sugars are not coming down.   Also increase metformin to 1000mg  twice per day  Please be very very careful of any low blood sugars as taking metformin and glipizide does increase risk for hypoglycemia Please continue check blood sugars.   The goal of fasting is less than 130.   Postprandial blood sugars goal is less than 160.    Ensure you have follow up in the next 2-3 months to recheck A1c.

## 2017-01-07 NOTE — Progress Notes (Signed)
Subjective:    Patient ID: Rachael Weeks, female    DOB: November 21, 1960, 56 y.o.   MRN: [id number]  CC: Rachael Weeks is a 55 y.o. female who presents today for follow up.   HPI: Feeling well today. No complaints.   DM- Fasting blood sugars am 211. 'low 200's'.  Coming down on metformin has had been 240's. Last a1c 10.2 On metformin 1000mg  QD and 500 mg QD.  Has been on glipizide in the past which helped.        HISTORY:  Past Medical History:  Diagnosis Date  . Chicken pox   . Diabetes mellitus without complication ([place])   . Kidney stones    Past Surgical History:  Procedure Laterality Date  . BREAST BIOPSY Right 1983   benign  . BREAST SURGERY    . INNER EAR SURGERY     Family History  Problem Relation Age of Onset  . Hypertension Mother   . Cancer Father        Prostate  . Hyperlipidemia Father   . Stroke Father   . Hypertension Father   . Diabetes Father   . Diabetes Sister   . Hyperlipidemia Brother   . Stroke Brother   . Hypertension Brother   . Diabetes Brother   . Diabetes Paternal Grandfather   . Breast cancer Paternal Aunt 19  . Diabetes Son   . Diabetes Sister   . Diabetes Brother   . Diabetes Brother   . Colon cancer Neg Hx     Allergies: Patient has no known allergies. Current Outpatient Prescriptions on File Prior to Visit  Medication Sig Dispense Refill  . estradiol (ESTRACE) 0.1 MG/GM vaginal cream 0.5 g intravaginally 1-3 times per week. 42.5 g 0  . glucose blood test strip 1 each by Other route 4 (four) times daily. Use to test blood sugar twice daily. 100 each 3  . Lancet Devices (B-D LANCET DEVICE) MISC Inject 1 application into the skin 4 (four) times daily. 100 each 3  . metFORMIN (GLUCOPHAGE XR) 500 MG 24 hr tablet Start 500mg  PO qpm. (Patient taking differently: Take 1,000 mg by mouth daily with supper. Start 500mg  PO qpm.) 90 tablet 3   No current facility-administered medications on file prior to visit.     Social History    Substance Use Topics  . Smoking status: Former Smoker    Packs/day: 1.00    Years: 15.00    Types: Cigarettes    Quit date: 09/08/2008  . Smokeless tobacco: Never Used  . Alcohol use No    Review of Systems  Constitutional: Negative for chills and fever.  Respiratory: Negative for cough.   Cardiovascular: Negative for chest pain and palpitations.  Gastrointestinal: Negative for nausea and vomiting.      Objective:    BP 132/74   Pulse 85   Temp 98.1 F (36.7 C) (Oral)   Ht 5' (1.524 m)   Wt 126 lb 9.6 oz (57.4 kg)   SpO2 98%   BMI 24.72 kg/m  BP Readings from Last 3 Encounters:  01/07/17 132/74  01/01/17 110/74  12/29/16 118/70   Wt Readings from Last 3 Encounters:  01/07/17 126 lb 9.6 oz (57.4 kg)  01/01/17 125 lb 4.8 oz (56.8 kg)  12/29/16 124 lb 8 oz (56.5 kg)    Physical Exam  Constitutional: She appears well-developed and well-nourished.  Eyes: Conjunctivae are normal.  Cardiovascular: Normal rate, regular rhythm, normal heart sounds and normal pulses.  Pulmonary/Chest: Effort normal and breath sounds normal. She has no wheezes. She has no rhonchi. She has no rales.  Neurological: She is alert.  Skin: Skin is warm and dry.  Psychiatric: She has a normal mood and affect. Her speech is normal and behavior is normal. Thought content normal.  Vitals reviewed.      Assessment & Plan:   Problem List Items Addressed This Visit      Endocrine   Diabetes mellitus without complication ([place]) - Primary    a1c 10. Uncontrolled. Increased metformin to 1000mg  BID. Start glipizide ( injectables discussed and this is patient preference) at 5mg  qam with breakfast, will likely increase to BID with meals. Follow up in 38months        Relevant Medications   glipiZIDE (GLUCOTROL) 5 MG tablet       I am having Rachael Weeks start on glipiZIDE. I am also having her maintain her metFORMIN, glucose blood, B-D LANCET DEVICE, and estradiol.   Meds ordered this encounter   Medications  . glipiZIDE (GLUCOTROL) 5 MG tablet    Sig: Take 1 tablet (5 mg total) by mouth daily before breakfast.    Dispense:  90 tablet    Refill:  1    Order Specific Question:   Supervising Provider    Answer:   Rachael Weeks [[id number]]    Return precautions given.   Risks, benefits, and alternatives of the medications and treatment plan prescribed today were discussed, and patient expressed understanding.   Education regarding symptom management and diagnosis given to patient on AVS.  Continue to follow with Rachael Hawthorne, FNP for routine health maintenance.   Rachael Weeks and I agreed with plan.   Rachael Paris, FNP

## 2017-01-07 NOTE — Assessment & Plan Note (Signed)
a1c 10. Uncontrolled. Increased metformin to 1000mg  BID. Start glipizide ( injectables discussed and this is patient preference) at 5mg  qam with breakfast, will likely increase to BID with meals. Follow up in 65months

## 2017-01-12 ENCOUNTER — Encounter: Payer: 59 | Admitting: Dietician

## 2017-01-12 ENCOUNTER — Encounter: Payer: Self-pay | Admitting: Dietician

## 2017-01-12 VITALS — BP 118/76 | Ht 60.0 in | Wt 126.8 lb

## 2017-01-12 DIAGNOSIS — Z713 Dietary counseling and surveillance: Secondary | ICD-10-CM | POA: Diagnosis not present

## 2017-01-12 DIAGNOSIS — E1165 Type 2 diabetes mellitus with hyperglycemia: Secondary | ICD-10-CM

## 2017-01-12 DIAGNOSIS — E119 Type 2 diabetes mellitus without complications: Secondary | ICD-10-CM | POA: Diagnosis not present

## 2017-01-12 NOTE — Progress Notes (Signed)
Diabetes Self-Management Education  Visit Type:  Follow-up  Appt. Start Time: 0900 Appt. End Time: 1000  01/12/2017  Rachael Weeks, identified by name and date of birth, is a 56 y.o. female with a diagnosis of Diabetes:  .   ASSESSMENT  Blood pressure 118/76, height 5' (1.524 m), weight 126 lb 12.8 oz (57.5 kg). Body mass index is 24.76 kg/m.       Diabetes Self-Management Education - 47/82/95 6213      Complications   How often do you check your blood sugar? 1-2 times/day  1x a day fasting   Fasting Blood glucose range (mg/dL) 180-200;>200  189 - >200   Have you had a dilated eye exam in the past 12 months? Yes   Have you had a dental exam in the past 12 months? No  appt for 04/2017   Are you checking your feet? Yes   How many days per week are you checking your feet? 7     Dietary Intake   Breakfast cereal or biscuit; was skipping several times a week, but trying to improve   Lunch often out   [employer] and vegetables or sandwich   Beverage(s) water, black coffee, coke zero     Exercise   Exercise Type Light (walking / raking leaves)   How many days per week to you exercise? 2   How many minutes per day do you exercise? 10   Total minutes per week of exercise 20     Patient Education   Nutrition management  Role of diet in the treatment of diabetes and the relationship between the three main macronutrients and blood glucose level;Food label reading, portion sizes and measuring food.;Meal options for control of blood glucose level and chronic complications.;Other (comment)  basic meal planning for weight maintenance   Physical activity and exercise  Role of exercise on diabetes management, blood pressure control and cardiac health.;Helped patient identify appropriate exercises in relation to his/her diabetes, diabetes complications and other health issue.   Monitoring Taught/discussed recording of test results and interpretation of SMBG.     Post-Education  Assessment   Patient understands the diabetes disease and treatment process. Demonstrates understanding / competency   Patient understands incorporating nutritional management into lifestyle. Demonstrates understanding / competency   Patient undertands incorporating physical activity into lifestyle. Demonstrates understanding / competency   Patient understands using medications safely. Demonstrates understanding / competency   Patient understands monitoring blood glucose, interpreting and using results Demonstrates understanding / competency   Patient understands prevention, detection, and treatment of acute complications. Needs Review   Patient understands prevention, detection, and treatment of chronic complications. Demonstrates understanding / competency   Patient understands how to develop strategies to address psychosocial issues. Needs Review   Patient understands how to develop strategies to promote health/change behavior. Demonstrates understanding / competency     Outcomes   Program Status Completed      Learning Objective:  Patient will have a greater understanding of diabetes self-management. Patient education plan is to attend individual and/or group sessions per assessed needs and concerns.  Rachael Weeks has made diet and lifestyle changes to work on improving blood sugar control. She is able to verbalize the meal planning process. She is making healthy food choices, and will continue to work to improve balance; she also plans to gradually increase exercise duration and/or frequency.    Plan:   Patient Instructions   Include a protein, healthy carb., and a vegetable or fruit with  each meal.   Use menus, food lists and meal outline to help in planning balanced meals.   Keep working to further increase exercise time, and/or the number of days each week.  Keep up your healthy food choices, you are doing a great job!    Expected Outcomes:  Demonstrated interest in learning.  Expect positive outcomes  Education material provided: Planning A Balanced Meal; Quick and Healthy Meal Ideas  If problems or questions, patient to contact team via:  Phone and Email

## 2017-01-12 NOTE — Patient Instructions (Signed)
   Include a protein, healthy carb., and a vegetable or fruit with each meal.   Use menus, food lists and meal outline to help in planning balanced meals.   Keep working to further increase exercise time, and/or the number of days each week.  Keep up your healthy food choices, you are doing a great job!

## 2017-01-14 ENCOUNTER — Telehealth: Payer: Self-pay | Admitting: Family

## 2017-01-14 NOTE — Telephone Encounter (Signed)
Patient has been informed.

## 2017-01-14 NOTE — Telephone Encounter (Signed)
Call pt  She didn't receive this mychart message  Vaginal estrogen is ocalized as opposed to oral estrogen which carries more risk.   I would tell you to use the cream 1-3 times per week for the next several weeks ( then stop altogether) - then we can repeat Your pap to see if the atrophy has improved. I sent to [facility].  Hope you are well!

## 2017-01-25 DIAGNOSIS — H3561 Retinal hemorrhage, right eye: Secondary | ICD-10-CM | POA: Diagnosis not present

## 2017-01-25 DIAGNOSIS — E119 Type 2 diabetes mellitus without complications: Secondary | ICD-10-CM | POA: Diagnosis not present

## 2017-01-25 DIAGNOSIS — H5213 Myopia, bilateral: Secondary | ICD-10-CM | POA: Diagnosis not present

## 2017-01-25 DIAGNOSIS — H524 Presbyopia: Secondary | ICD-10-CM | POA: Diagnosis not present

## 2017-01-25 DIAGNOSIS — H52223 Regular astigmatism, bilateral: Secondary | ICD-10-CM | POA: Diagnosis not present

## 2017-02-16 ENCOUNTER — Ambulatory Visit: Payer: 59 | Admitting: Internal Medicine

## 2017-03-23 ENCOUNTER — Ambulatory Visit: Payer: 59 | Admitting: Family

## 2017-04-05 ENCOUNTER — Encounter: Payer: Self-pay | Admitting: Family

## 2017-04-05 ENCOUNTER — Ambulatory Visit (INDEPENDENT_AMBULATORY_CARE_PROVIDER_SITE_OTHER): Payer: 59 | Admitting: Family

## 2017-04-05 VITALS — BP 130/78 | HR 79 | Temp 98.4°F | Ht 60.0 in | Wt 126.2 lb

## 2017-04-05 DIAGNOSIS — E119 Type 2 diabetes mellitus without complications: Secondary | ICD-10-CM | POA: Diagnosis not present

## 2017-04-05 LAB — CBC WITH DIFFERENTIAL/PLATELET
BASOS ABS: 0 10*3/uL (ref 0.0–0.1)
Basophils Relative: 0.4 % (ref 0.0–3.0)
EOS ABS: 0.1 10*3/uL (ref 0.0–0.7)
Eosinophils Relative: 1.5 % (ref 0.0–5.0)
HEMATOCRIT: 45.4 % (ref 36.0–46.0)
Hemoglobin: 15.2 g/dL — ABNORMAL HIGH (ref 12.0–15.0)
LYMPHS PCT: 18.3 % (ref 12.0–46.0)
Lymphs Abs: 1.4 10*3/uL (ref 0.7–4.0)
MCHC: 33.4 g/dL (ref 30.0–36.0)
MCV: 91.2 fl (ref 78.0–100.0)
Monocytes Absolute: 0.7 10*3/uL (ref 0.1–1.0)
Monocytes Relative: 9.1 % (ref 3.0–12.0)
NEUTROS ABS: 5.5 10*3/uL (ref 1.4–7.7)
NEUTROS PCT: 70.7 % (ref 43.0–77.0)
PLATELETS: 228 10*3/uL (ref 150.0–400.0)
RBC: 4.98 Mil/uL (ref 3.87–5.11)
RDW: 13.5 % (ref 11.5–15.5)
WBC: 7.8 10*3/uL (ref 4.0–10.5)

## 2017-04-05 LAB — HEMOGLOBIN A1C: HEMOGLOBIN A1C: 9.9 % — AB (ref 4.6–6.5)

## 2017-04-05 NOTE — Progress Notes (Signed)
Subjective:    Patient ID: Rachael Weeks, female    DOB: August 03, 1960, 56 y.o.   MRN: [id number]  CC: Rachael Weeks is a 56 y.o. female who presents today for follow up.   HPI: Doing well today. No complaints. DM- take 500 mg XR and then 500mg  XR in the afternoon. NO hypoglycemic episodes. Reports still not eating very healthy however working on it.   FBG- 180s    HISTORY:  Past Medical History:  Diagnosis Date  . Chicken pox   . Diabetes mellitus without complication ([place])   . Kidney stones    Past Surgical History:  Procedure Laterality Date  . BREAST BIOPSY Right 1983   benign  . BREAST SURGERY    . INNER EAR SURGERY     Family History  Problem Relation Age of Onset  . Hypertension Mother   . Cancer Father        Prostate  . Hyperlipidemia Father   . Stroke Father   . Hypertension Father   . Diabetes Father   . Diabetes Sister   . Hyperlipidemia Brother   . Stroke Brother   . Hypertension Brother   . Diabetes Brother   . Diabetes Paternal Grandfather   . Breast cancer Paternal Aunt 53  . Diabetes Son   . Diabetes Sister   . Diabetes Brother   . Diabetes Brother   . Colon cancer Neg Hx     Allergies: Patient has no known allergies. Current Outpatient Medications on File Prior to Visit  Medication Sig Dispense Refill  . estradiol (ESTRACE) 0.1 MG/GM vaginal cream 0.5 g intravaginally 1-3 times per week. 42.5 g 0  . glipiZIDE (GLUCOTROL) 5 MG tablet Take 1 tablet (5 mg total) by mouth daily before breakfast. 90 tablet 1  . glucose blood test strip 1 each by Other route 4 (four) times daily. Use to test blood sugar twice daily. 100 each 3  . Lancet Devices (B-D LANCET DEVICE) MISC Inject 1 application into the skin 4 (four) times daily. 100 each 3  . metFORMIN (GLUCOPHAGE XR) 500 MG 24 hr tablet Start 500mg  PO qpm. (Patient taking differently: Take 1,000 mg by mouth daily with supper. Start 500mg  PO qpm.) 90 tablet 3   No current facility-administered  medications on file prior to visit.     Social History   Tobacco Use  . Smoking status: Former Smoker    Packs/day: 1.00    Years: 15.00    Pack years: 15.00    Types: Cigarettes    Last attempt to quit: 09/08/2008    Years since quitting: 8.5  . Smokeless tobacco: Never Used  Substance Use Topics  . Alcohol use: No  . Drug use: No    Review of Systems  Constitutional: Negative for chills and fever.  Respiratory: Negative for cough.   Cardiovascular: Negative for chest pain and palpitations.  Gastrointestinal: Negative for nausea and vomiting.      Objective:    BP 130/78   Pulse 79   Temp 98.4 F (36.9 C) (Oral)   Ht 5' (1.524 m)   Wt 126 lb 3.2 oz (57.2 kg)   BMI 24.65 kg/m  BP Readings from Last 3 Encounters:  04/05/17 130/78  01/12/17 118/76  01/07/17 132/74   Wt Readings from Last 3 Encounters:  04/05/17 126 lb 3.2 oz (57.2 kg)  01/12/17 126 lb 12.8 oz (57.5 kg)  01/07/17 126 lb 9.6 oz (57.4 kg)    Physical Exam  Constitutional: She appears well-developed and well-nourished.  Eyes: Conjunctivae are normal.  Cardiovascular: Normal rate, regular rhythm, normal heart sounds and normal pulses.  Pulmonary/Chest: Effort normal and breath sounds normal. She has no wheezes. She has no rhonchi. She has no rales.  Neurological: She is alert.  Skin: Skin is warm and dry.  Psychiatric: She has a normal mood and affect. Her speech is normal and behavior is normal. Thought content normal.  Vitals reviewed.      Assessment & Plan:   Problem List Items Addressed This Visit      Endocrine   Diabetes mellitus without complication ([place]) - Primary    Appears uncontrolled. Will see if continues to head in right direction. Pending a1c.       Relevant Orders   Hemoglobin A1c   CBC with Differential/Platelet       I am having Kieanna H. Fenter maintain her metFORMIN, glucose blood, B-D LANCET DEVICE, estradiol, and glipiZIDE.   No orders of the defined types  were placed in this encounter.   Return precautions given.   Risks, benefits, and alternatives of the medications and treatment plan prescribed today were discussed, and patient expressed understanding.   Education regarding symptom management and diagnosis given to patient on AVS.  Continue to follow with Burnard Hawthorne, FNP for routine health maintenance.   Romie Levee Budzik and I agreed with plan.   Mable Paris, FNP

## 2017-04-05 NOTE — Patient Instructions (Signed)
Labs today  Happy to see you, always!

## 2017-04-05 NOTE — Progress Notes (Signed)
Pre visit review using our clinic review tool, if applicable. No additional management support is needed unless otherwise documented below in the visit note. 

## 2017-04-05 NOTE — Assessment & Plan Note (Signed)
Appears uncontrolled. Will see if continues to head in right direction. Pending a1c.

## 2017-04-07 ENCOUNTER — Other Ambulatory Visit: Payer: Self-pay | Admitting: Family

## 2017-04-07 ENCOUNTER — Encounter: Payer: Self-pay | Admitting: Family

## 2017-04-07 DIAGNOSIS — E119 Type 2 diabetes mellitus without complications: Secondary | ICD-10-CM

## 2017-04-07 MED ORDER — EMPAGLIFLOZIN 10 MG PO TABS: 10.0000 mg | ORAL_TABLET | Freq: Every morning | ORAL | 1 refills | Status: DC

## 2017-05-05 ENCOUNTER — Encounter: Payer: Self-pay | Admitting: Emergency Medicine

## 2017-05-05 ENCOUNTER — Ambulatory Visit
Admission: EM | Admit: 2017-05-05 | Discharge: 2017-05-05 | Disposition: A | Discharge status: Home / Self Care | Payer: 59 | Attending: Family Medicine | Admitting: Family Medicine

## 2017-05-05 ENCOUNTER — Other Ambulatory Visit: Payer: Self-pay

## 2017-05-05 DIAGNOSIS — S46811A Strain of other muscles, fascia and tendons at shoulder and upper arm level, right arm, initial encounter: Secondary | ICD-10-CM

## 2017-05-05 MED ORDER — CYCLOBENZAPRINE HCL 10 MG PO TABS: 10.0000 mg | ORAL_TABLET | Freq: Two times a day (BID) | ORAL | 0 refills | Status: DC | PRN

## 2017-05-05 MED ORDER — MELOXICAM 15 MG PO TABS: 15.0000 mg | ORAL_TABLET | Freq: Every day | ORAL | 0 refills | Status: DC | PRN

## 2017-05-05 MED ORDER — KETOROLAC TROMETHAMINE 30 MG/ML IJ SOLN
30.0000 mg | Freq: Once | INTRAMUSCULAR | Status: AC
  Administered 2017-05-05: 30 mg via INTRAMUSCULAR

## 2017-05-05 NOTE — ED Triage Notes (Signed)
Patient c/o right sided neck pain that started Sunday evening.  Patient states that she has been moving furniture over the weekend.

## 2017-05-05 NOTE — ED Provider Notes (Signed)
[facility] ____________________________________________  Time seen: Approximately 4:24 PM  I have reviewed the triage vital signs and the nursing notes.   HISTORY  Chief Complaint Neck Pain (right side)   HPI Rachael Weeks is a 56 y.o. female presenting for evaluation of right neck shoulder pain that has been present for the last 3 days.  States no fall or direct injury.  States over this past weekend she and her family were moving a lot of furniture and felt very mild pain during that time.  However reports the next day she continued to feel more pain that increased.  States pain is improved with massage, but reports does not go away.  States has been taking over-the-counter ibuprofen which helps but again does not resolve.  Denies any right paresthesias, pain radiation, swelling, skin changes, chest pain, shortness of breath or other complaints.  Reports otherwise feels well.  States that movement aggravates the pain.  Denies other aggravating or alleviating factors. Denies chest pain, shortness of breath, or rash. Denies recent sickness. Denies recent antibiotic use.  Denies renal insufficiency.  Denies cardiac history.  Burnard Hawthorne, FNP: PCP   Past Medical History:  Diagnosis Date  . Chicken pox   . Diabetes mellitus without complication ([place])   . Kidney stones     Patient Active Problem List   Diagnosis Date Noted  . History of smoking 12/29/2016  . Shortness of breath 12/29/2016  . Chest pain 12/17/2016  . Rash 12/17/2016  . Mass 12/17/2016  . Diabetes mellitus without complication ([place]) 29/51/8841  . Routine physical examination 09/17/2016    Past Surgical History:  Procedure Laterality Date  . BREAST BIOPSY Right 1983   benign  . BREAST SURGERY    . INNER EAR SURGERY       No current facility-administered medications for this encounter.   Current Outpatient Medications:  .  empagliflozin (JARDIANCE) 10 MG TABS tablet, Take 10 mg every  morning by mouth., Disp: 90 tablet, Rfl: 1 .  estradiol (ESTRACE) 0.1 MG/GM vaginal cream, 0.5 g intravaginally 1-3 times per week., Disp: 42.5 g, Rfl: 0 .  glipiZIDE (GLUCOTROL) 5 MG tablet, Take 1 tablet (5 mg total) by mouth daily before breakfast., Disp: 90 tablet, Rfl: 1 .  glucose blood test strip, 1 each by Other route 4 (four) times daily. Use to test blood sugar twice daily., Disp: 100 each, Rfl: 3 .  Lancet Devices (B-D LANCET DEVICE) MISC, Inject 1 application into the skin 4 (four) times daily., Disp: 100 each, Rfl: 3 .  metFORMIN (GLUCOPHAGE XR) 500 MG 24 hr tablet, Start 500mg  PO qpm. (Patient taking differently: Take 1,000 mg by mouth daily with supper. Start 500mg  PO qpm.), Disp: 90 tablet, Rfl: 3 .  cyclobenzaprine (FLEXERIL) 10 MG tablet, Take 1 tablet (10 mg total) by mouth 2 (two) times daily as needed for muscle spasms. Do not drive while taking as can cause drowsiness, Disp: 15 tablet, Rfl: 0 .  meloxicam (MOBIC) 15 MG tablet, Take 1 tablet (15 mg total) by mouth daily as needed for pain., Disp: 10 tablet, Rfl: 0  Allergies Patient has no known allergies.  Family History  Problem Relation Age of Onset  . Hypertension Mother   . Cancer Father        Prostate  . Hyperlipidemia Father   . Stroke Father   . Hypertension Father   . Diabetes Father   . Diabetes Sister   . Hyperlipidemia Brother   .  Stroke Brother   . Hypertension Brother   . Diabetes Brother   . Diabetes Paternal Grandfather   . Breast cancer Paternal Aunt 73  . Diabetes Son   . Diabetes Sister   . Diabetes Brother   . Diabetes Brother   . Colon cancer Neg Hx     Social History Social History   Tobacco Use  . Smoking status: Former Smoker    Packs/day: 1.00    Years: 15.00    Pack years: 15.00    Types: Cigarettes    Last attempt to quit: 09/08/2008    Years since quitting: 8.6  . Smokeless tobacco: Never Used  Substance Use Topics  . Alcohol use: No  . Drug use: No    Review of  Systems Constitutional: No fever/chills Cardiovascular: Denies chest pain. Respiratory: Denies shortness of breath. Gastrointestinal: No abdominal pain. Musculoskeletal: AS above. Skin: Negative for rash.  ____________________________________________   PHYSICAL EXAM:  VITAL SIGNS: ED Triage Vitals  Enc Vitals Group     BP 05/05/17 1613 126/67     Pulse Rate 05/05/17 1613 97     Resp 05/05/17 1613 14     Temp 05/05/17 1613 97.6 F (36.4 C)     Temp Source 05/05/17 1613 Oral     SpO2 05/05/17 1613 99 %     Weight 05/05/17 1611 127 lb (57.6 kg)     Height 05/05/17 1611 5' (1.524 m)     Head Circumference --      Peak Flow --      Pain Score 05/05/17 1611 4     Pain Loc --      Pain Edu? --      Excl. in [place]? --     Constitutional: Alert and oriented. Well appearing and in no acute distress. Cardiovascular: Normal rate, regular rhythm. Grossly normal heart sounds.  Good peripheral circulation. Respiratory: Normal respiratory effort without tachypnea nor retractions. Breath sounds are clear and equal bilaterally. No wheezes, rales, rhonchi. Musculoskeletal:No midline cervical, thoracic or lumbar tenderness to palpation. Bilateral distal radial pulses equal and easily palpated. Except: Point right trapezius tenderness to palpation and tightness noted, no palpable muscle spasm, pain increases with cervical flexion and extension as well as rotation right and left, but full range of motion present, no edema, no erythema, No point bony tenderness, no paresthesias, bilateral hand grip strong and equal, no motor or tendon deficits noted to right upper extremity. Neurologic:  Normal speech and language. Speech is normal. No gait instability.  Skin:  Skin is warm, dry and intact. No rash noted. Psychiatric: Mood and affect are normal. Speech and behavior are normal. Patient exhibits appropriate insight and judgment   ___________________________________________   LABS (all labs ordered  are listed, but only abnormal results are displayed)  Labs Reviewed - No data to display ____________________________________________   PROCEDURES Procedures    INITIAL IMPRESSION / ASSESSMENT AND PLAN / ED COURSE  Pertinent labs & imaging results that were available during my care of the patient were reviewed by me and considered in my medical decision making (see chart for details).  Very well-appearing patient.  No acute distress.  Suspect right trapezius strain after recent activity.  No midline tenderness and denies trauma.  Pain reproducible on exam.  30 mg IM Toradol given once in urgent care with improvement of pain per patient.  Will treat at home with oral daily Mobic and as needed Flexeril.  Encourage stretching, avoidance of aggravating factors and supportive  care. Discussed indication, risks and benefits of medications with patient.  Discussed follow up with Primary care physician this week as needed. Discussed follow up and return parameters including no resolution or any worsening concerns. Patient verbalized understanding and agreed to plan.   ____________________________________________   FINAL CLINICAL IMPRESSION(S) / ED DIAGNOSES  Final diagnoses:  Strain of right trapezius muscle, initial encounter     ED Discharge Orders        Ordered    meloxicam (MOBIC) 15 MG tablet  Daily PRN     05/05/17 1634    cyclobenzaprine (FLEXERIL) 10 MG tablet  2 times daily PRN     05/05/17 1634       Note: This dictation was prepared with Dragon dictation along with smaller phrase technology. Any transcriptional errors that result from this process are unintentional.         Marylene Land, NP 05/05/17 2024

## 2017-05-05 NOTE — ED Notes (Signed)
Patient shows no signs of adverse reaction to medication at this time.  

## 2017-05-05 NOTE — Discharge Instructions (Signed)
Take medication as prescribed. Rest. Drink plenty of fluids. Stretch.  ° °Follow up with your primary care physician this week as needed. Return to Urgent care for new or worsening concerns.  ° °

## 2018-01-04 DIAGNOSIS — H5213 Myopia, bilateral: Secondary | ICD-10-CM | POA: Diagnosis not present

## 2018-01-04 DIAGNOSIS — H52223 Regular astigmatism, bilateral: Secondary | ICD-10-CM | POA: Diagnosis not present

## 2018-01-04 DIAGNOSIS — H524 Presbyopia: Secondary | ICD-10-CM | POA: Diagnosis not present

## 2018-01-04 DIAGNOSIS — E113293 Type 2 diabetes mellitus with mild nonproliferative diabetic retinopathy without macular edema, bilateral: Secondary | ICD-10-CM | POA: Diagnosis not present

## 2018-02-12 LAB — HM DIABETES EYE EXAM

## 2018-07-12 DIAGNOSIS — H524 Presbyopia: Secondary | ICD-10-CM | POA: Diagnosis not present

## 2018-07-12 DIAGNOSIS — H52223 Regular astigmatism, bilateral: Secondary | ICD-10-CM | POA: Diagnosis not present

## 2018-07-12 DIAGNOSIS — E113293 Type 2 diabetes mellitus with mild nonproliferative diabetic retinopathy without macular edema, bilateral: Secondary | ICD-10-CM | POA: Diagnosis not present

## 2018-07-12 DIAGNOSIS — H5213 Myopia, bilateral: Secondary | ICD-10-CM | POA: Diagnosis not present

## 2018-08-31 ENCOUNTER — Telehealth: Payer: Self-pay | Admitting: Family Medicine

## 2018-08-31 NOTE — Telephone Encounter (Signed)
Patient needs appt set up, has not been to office since 04/2017  Due for:  A1c and other fasting labs  Urine microalbumin  Foot exam  Tdap  Mammogram due June 2020  Opthalmology exam for diabetics due 01/2019

## 2018-09-02 NOTE — Telephone Encounter (Signed)
I corrected mychart message by sending a new one clarifying to schedule with Joycelyn Schmid.

## 2018-09-02 NOTE — Telephone Encounter (Signed)
Mychart msg sent to pt.

## 2019-01-12 DIAGNOSIS — H524 Presbyopia: Secondary | ICD-10-CM | POA: Diagnosis not present

## 2019-01-12 DIAGNOSIS — H2513 Age-related nuclear cataract, bilateral: Secondary | ICD-10-CM | POA: Diagnosis not present

## 2019-01-12 DIAGNOSIS — H5213 Myopia, bilateral: Secondary | ICD-10-CM | POA: Diagnosis not present

## 2019-01-12 DIAGNOSIS — H52223 Regular astigmatism, bilateral: Secondary | ICD-10-CM | POA: Diagnosis not present

## 2019-01-12 DIAGNOSIS — E113392 Type 2 diabetes mellitus with moderate nonproliferative diabetic retinopathy without macular edema, left eye: Secondary | ICD-10-CM | POA: Diagnosis not present

## 2019-01-12 DIAGNOSIS — E113291 Type 2 diabetes mellitus with mild nonproliferative diabetic retinopathy without macular edema, right eye: Secondary | ICD-10-CM | POA: Diagnosis not present

## 2019-01-12 LAB — HM DIABETES EYE EXAM

## 2019-01-18 ENCOUNTER — Encounter: Payer: Self-pay | Admitting: Lab

## 2020-01-16 ENCOUNTER — Other Ambulatory Visit: Payer: Self-pay

## 2020-01-16 ENCOUNTER — Emergency Department
Admission: EM | Admit: 2020-01-16 | Discharge: 2020-01-16 | Disposition: A | Discharge status: Home / Self Care | Payer: 59 | Attending: Emergency Medicine | Admitting: Emergency Medicine

## 2020-01-16 DIAGNOSIS — E1165 Type 2 diabetes mellitus with hyperglycemia: Secondary | ICD-10-CM | POA: Insufficient documentation

## 2020-01-16 DIAGNOSIS — R42 Dizziness and giddiness: Secondary | ICD-10-CM | POA: Diagnosis not present

## 2020-01-16 DIAGNOSIS — Z20822 Contact with and (suspected) exposure to covid-19: Secondary | ICD-10-CM | POA: Diagnosis not present

## 2020-01-16 DIAGNOSIS — R110 Nausea: Secondary | ICD-10-CM | POA: Insufficient documentation

## 2020-01-16 DIAGNOSIS — Z87891 Personal history of nicotine dependence: Secondary | ICD-10-CM | POA: Insufficient documentation

## 2020-01-16 DIAGNOSIS — Z7984 Long term (current) use of oral hypoglycemic drugs: Secondary | ICD-10-CM | POA: Insufficient documentation

## 2020-01-16 DIAGNOSIS — R739 Hyperglycemia, unspecified: Secondary | ICD-10-CM

## 2020-01-16 LAB — URINALYSIS, COMPLETE (UACMP) WITH MICROSCOPIC
Bacteria, UA: NONE SEEN
Bilirubin Urine: NEGATIVE
Glucose, UA: >=500 mg/dL — AB
Hgb urine dipstick: NEGATIVE
Ketones, ur: 80 mg/dL — AB
Leukocytes,Ua: NEGATIVE
Nitrite: NEGATIVE
Protein, ur: NEGATIVE mg/dL
Specific Gravity, Urine: 1.032 — ABNORMAL HIGH (ref 1.005–1.030)
Squamous Epithelial / LPF: NONE SEEN (ref 0–5)
pH: 5 (ref 5.0–8.0)

## 2020-01-16 LAB — BASIC METABOLIC PANEL
Anion gap: 11 (ref 5–15)
BUN: 11 mg/dL (ref 6–20)
CO2: 23 mmol/L (ref 22–32)
Calcium: 9 mg/dL (ref 8.9–10.3)
Chloride: 102 mmol/L (ref 98–111)
Creatinine, Ser: 0.46 mg/dL (ref 0.44–1.00)
GFR calc Af Amer: >60 mL/min (ref >60)
GFR calc non Af Amer: >60 mL/min (ref >60)
Glucose, Bld: 360 mg/dL — ABNORMAL HIGH (ref 70–99)
Potassium: 4 mmol/L (ref 3.5–5.1)
Sodium: 136 mmol/L (ref 135–145)

## 2020-01-16 LAB — CBC
HCT: 43.6 % (ref 36.0–46.0)
Hemoglobin: 15.8 g/dL — ABNORMAL HIGH (ref 12.0–15.0)
MCH: 30.4 pg (ref 26.0–34.0)
MCHC: 36.2 g/dL — ABNORMAL HIGH (ref 30.0–36.0)
MCV: 84 fL (ref 80.0–100.0)
Platelets: 192 10*3/uL (ref 150–400)
RBC: 5.19 MIL/uL — ABNORMAL HIGH (ref 3.87–5.11)
RDW: 11.8 % (ref 11.5–15.5)
WBC: 6.5 10*3/uL (ref 4.0–10.5)
nRBC: 0 % (ref 0.0–0.2)

## 2020-01-16 LAB — SARS CORONAVIRUS 2 BY RT PCR (HOSPITAL ORDER, PERFORMED IN ~~LOC~~ HOSPITAL LAB): SARS Coronavirus 2: NEGATIVE

## 2020-01-16 MED ORDER — LACTATED RINGERS IV BOLUS
1000.0000 mL | Freq: Once | INTRAVENOUS | Status: AC
  Administered 2020-01-16: 1000 mL via INTRAVENOUS

## 2020-01-16 NOTE — ED Triage Notes (Signed)
Pt states she was on her way to pick up her son and became nauseous and dizzy. A&O, speech clear. States BP is elevated for her. Type 2 DM. Denies blurred vision or sensation loss. States CBG was 270 when she checked earlier today. Denies urinary issues.

## 2020-01-16 NOTE — ED Provider Notes (Signed)
[facility] Emergency Department Provider Note   ____________________________________________   First MD Initiated Contact with Patient 01/16/20 2027     (approximate)  I have reviewed the triage vital signs and the nursing notes.   HISTORY  Chief Complaint Dizziness    HPI Rachael Weeks is a 60 y.o. female with past medical history of diabetes who presents to the ED complaining of lightheadedness.  Patient reports that she was on her way to the hospital earlier today to pick up her son when she started feeling lightheaded and nauseous.  She felt like she might pass out but denies any associated chest pain or shortness of breath.  She denies any changes in her vision, numbness, or weakness.  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  She is not vaccinated against COVID-19.  She also admits that she has been off her diabetic medication for multiple months.        Past Medical History:  Diagnosis Date  . Chicken pox   . Diabetes mellitus without complication ([place])   . Kidney stones     Patient Active Problem List   Diagnosis Date Noted  . History of smoking 12/29/2016  . Shortness of breath 12/29/2016  . Chest pain 12/17/2016  . Rash 12/17/2016  . Mass 12/17/2016  . Diabetes mellitus without complication ([place]) 38/93/7342  . Routine physical examination 09/17/2016    Past Surgical History:  Procedure Laterality Date  . BREAST BIOPSY Right 1983   benign  . BREAST SURGERY    . INNER EAR SURGERY      Prior to Admission medications   Medication Sig Start Date End Date Taking? Authorizing Provider  cyclobenzaprine (FLEXERIL) 10 MG tablet Take 1 tablet (10 mg total) by mouth 2 (two) times daily as needed for muscle spasms. Do not drive while taking as can cause drowsiness 05/05/17   Marylene Land, NP  empagliflozin  (JARDIANCE) 10 MG TABS tablet Take 10 mg every morning by mouth. 04/07/17   Burnard Hawthorne, FNP  estradiol (ESTRACE) 0.1 MG/GM vaginal cream 0.5 g intravaginally 1-3 times per week. 12/31/16   Burnard Hawthorne, FNP  glipiZIDE (GLUCOTROL) 5 MG tablet Take 1 tablet (5 mg total) by mouth daily before breakfast. 01/07/17   Burnard Hawthorne, FNP  glucose blood test strip 1 each by Other route 4 (four) times daily. Use to test blood sugar twice daily. 09/24/16   Burnard Hawthorne, FNP  Lancet Devices (B-D LANCET DEVICE) MISC Inject 1 application into the skin 4 (four) times daily. 09/24/16   Burnard Hawthorne, FNP  meloxicam (MOBIC) 15 MG tablet Take 1 tablet (15 mg total) by mouth daily as needed for pain. 05/05/17   Marylene Land, NP  metFORMIN (GLUCOPHAGE XR) 500 MG 24 hr tablet Start 500mg  PO qpm. Patient taking differently: Take 1,000 mg by mouth daily with supper. Start 500mg  PO qpm. 09/24/16   Burnard Hawthorne, FNP    Allergies Patient has no known allergies.  Family History  Problem Relation Age of Onset  . Hypertension Mother   . Cancer Father        Prostate  . Hyperlipidemia Father   . Stroke Father   . Hypertension Father   . Diabetes Father   . Diabetes Sister   . Hyperlipidemia Brother   . Stroke Brother   .  Hypertension Brother   . Diabetes Brother   . Diabetes Paternal Grandfather   . Breast cancer Paternal Aunt 58  . Diabetes Son   . Diabetes Sister   . Diabetes Brother   . Diabetes Brother   . Colon cancer Neg Hx     Social History Social History   Tobacco Use  . Smoking status: Former Smoker    Packs/day: 1.00    Years: 15.00    Pack years: 15.00    Types: Cigarettes    Quit date: 09/08/2008    Years since quitting: 11.3  . Smokeless tobacco: Never Used  Vaping Use  . Vaping Use: Never used  Substance Use Topics  . Alcohol use: No  . Drug use: No    Review of Systems  Constitutional: No fever/chills.  Positive for lightheadedness and  malaise. Eyes: No visual changes. ENT: Positive for sore throat. Cardiovascular: Denies chest pain. Respiratory: Denies shortness of breath. Gastrointestinal: No abdominal pain.  Positive for nausea, no vomiting.  No diarrhea.  No constipation. Genitourinary: Negative for dysuria. Musculoskeletal: Negative for back pain. Skin: Negative for rash. Neurological: Negative for headaches, focal weakness or numbness.  ____________________________________________   PHYSICAL EXAM:  VITAL SIGNS: ED Triage Vitals  Enc Vitals Group     BP 01/16/20 1403 (!) 149/61     Pulse Rate 01/16/20 1403 96     Resp 01/16/20 1403 16     Temp 01/16/20 1403 98.4 F (36.9 C)     Temp Source 01/16/20 1403 Oral     SpO2 01/16/20 1403 99 %     Weight 01/16/20 1402 137 lb (62.1 kg)     Height 01/16/20 1402 5' (1.524 m)     Head Circumference --      Peak Flow --      Pain Score 01/16/20 1412 0     Pain Loc --      Pain Edu? --      Excl. in [place]? --     Constitutional: Alert and oriented. Eyes: Conjunctivae are normal. Head: Atraumatic. Nose: No congestion/rhinnorhea. Mouth/Throat: Mucous membranes are moist. Neck: Normal ROM Cardiovascular: Normal rate, regular rhythm. Grossly normal heart sounds. Respiratory: Normal respiratory effort.  No retractions. Lungs CTAB. Gastrointestinal: Soft and nontender. No distention. Genitourinary: deferred Musculoskeletal: No lower extremity tenderness nor edema. Neurologic:  Normal speech and language. No gross focal neurologic deficits are appreciated. Skin:  Skin is warm, dry and intact. No rash noted. Psychiatric: Mood and affect are normal. Speech and behavior are normal.  ____________________________________________   LABS (all labs ordered are listed, but only abnormal results are displayed)  Labs Reviewed  BASIC METABOLIC PANEL - Abnormal; Notable for the following components:      Result Value   Glucose, Bld 360 (*)    All other components within  normal limits  CBC - Abnormal; Notable for the following components:   RBC 5.19 (*)    Hemoglobin 15.8 (*)    MCHC 36.2 (*)    All other components within normal limits  URINALYSIS, COMPLETE (UACMP) WITH MICROSCOPIC - Abnormal; Notable for the following components:   Color, Urine YELLOW (*)    APPearance CLEAR (*)    Specific Gravity, Urine 1.032 (*)    Glucose, UA >=500 (*)    Ketones, ur 80 (*)    All other components within normal limits  SARS CORONAVIRUS 2 BY RT PCR (HOSPITAL ORDER, [facility])  CBG MONITORING, ED  CBG MONITORING,  ED   ____________________________________________  EKG  ED ECG REPORT I, Blake Divine, the attending physician, personally viewed and interpreted this ECG.   Date: 01/16/2020  EKG Time: 14:05  Rate: 89  Rhythm: normal sinus rhythm  Axis: Normal  Intervals:none  ST&T Change: None   PROCEDURES  Procedure(s) performed (including Critical Care):  Procedures   ____________________________________________   INITIAL IMPRESSION / ASSESSMENT AND PLAN / ED COURSE       59 year old female with past medical history of diabetes presents to the ED complaining of episode of lightheadedness and nausea earlier today that has since resolved.  She denies any associated chest pain or shortness of breath and I doubt cardiac etiology for her symptoms, EKG is unremarkable.  Lab work is remarkable for hyperglycemia but is otherwise reassuring, no acidosis to suggest DKA.  UA is also unremarkable.  We will hydrate with IV fluids as hyperglycemia and dehydration could be contributing to her lightheadedness.  She is also concerned about exposure to COVID-19, but is not in any respiratory distress at this time.  We will perform COVID-19 testing, but after this and IV fluids she will be appropriate for discharge home with PCP follow-up.  She was encouraged to discuss getting back on her diabetic medications with her PCP.  Patient feeling  better following IV fluid bolus, COVID-19 results are pending at this time but she is appropriate for discharge home.  She was counseled to quarantine if results are positive, otherwise follow-up with her PCP for management of her diabetes.  Patient counseled to return to the ED for new or worsening symptoms, patient agrees with plan.      ____________________________________________   FINAL CLINICAL IMPRESSION(S) / ED DIAGNOSES  Final diagnoses:  Lightheadedness  Hyperglycemia     ED Discharge Orders    None       Note:  This document was prepared using Dragon voice recognition software and may include unintentional dictation errors.   Blake Divine, MD 01/16/20 2145

## 2020-01-16 NOTE — ED Notes (Signed)
Pt states that she started feeling dizzy this mornign and that she started getting a sore throat last night. Patient denies fevers, vomiting and diarrhea but states she did have quite a bit of nausea when she was feeling lightheaded. Denies chest pain.

## 2020-11-27 DIAGNOSIS — E113412 Type 2 diabetes mellitus with severe nonproliferative diabetic retinopathy with macular edema, left eye: Secondary | ICD-10-CM | POA: Diagnosis not present

## 2020-11-27 DIAGNOSIS — H5213 Myopia, bilateral: Secondary | ICD-10-CM | POA: Diagnosis not present

## 2020-11-27 DIAGNOSIS — H52223 Regular astigmatism, bilateral: Secondary | ICD-10-CM | POA: Diagnosis not present

## 2020-11-27 DIAGNOSIS — H524 Presbyopia: Secondary | ICD-10-CM | POA: Diagnosis not present

## 2020-11-27 DIAGNOSIS — H2513 Age-related nuclear cataract, bilateral: Secondary | ICD-10-CM | POA: Diagnosis not present

## 2020-11-27 DIAGNOSIS — E113491 Type 2 diabetes mellitus with severe nonproliferative diabetic retinopathy without macular edema, right eye: Secondary | ICD-10-CM | POA: Diagnosis not present

## 2020-11-27 LAB — HM DIABETES EYE EXAM

## 2020-12-17 ENCOUNTER — Telehealth: Payer: Self-pay | Admitting: Family

## 2020-12-17 DIAGNOSIS — E133499 Other specified diabetes mellitus with severe nonproliferative diabetic retinopathy without macular edema, unspecified eye: Secondary | ICD-10-CM

## 2020-12-17 DIAGNOSIS — E113499 Type 2 diabetes mellitus with severe nonproliferative diabetic retinopathy without macular edema, unspecified eye: Secondary | ICD-10-CM | POA: Insufficient documentation

## 2020-12-17 NOTE — Telephone Encounter (Signed)
Call pt  Rec;d notes from ophthalmology  Sch f/u  She hasnt been seen in 4 years

## 2020-12-18 NOTE — Telephone Encounter (Signed)
Patient called and she is scheduled for 01/06/21 for follow-up.

## 2020-12-25 DIAGNOSIS — H43811 Vitreous degeneration, right eye: Secondary | ICD-10-CM | POA: Diagnosis not present

## 2020-12-25 DIAGNOSIS — H4312 Vitreous hemorrhage, left eye: Secondary | ICD-10-CM | POA: Diagnosis not present

## 2020-12-25 DIAGNOSIS — E113511 Type 2 diabetes mellitus with proliferative diabetic retinopathy with macular edema, right eye: Secondary | ICD-10-CM | POA: Diagnosis not present

## 2020-12-25 DIAGNOSIS — E113412 Type 2 diabetes mellitus with severe nonproliferative diabetic retinopathy with macular edema, left eye: Secondary | ICD-10-CM | POA: Diagnosis not present

## 2021-01-06 ENCOUNTER — Ambulatory Visit: Payer: 59 | Admitting: Family

## 2021-01-06 ENCOUNTER — Encounter: Payer: Self-pay | Admitting: Family

## 2021-01-06 ENCOUNTER — Other Ambulatory Visit: Payer: Self-pay

## 2021-01-06 ENCOUNTER — Ambulatory Visit (INDEPENDENT_AMBULATORY_CARE_PROVIDER_SITE_OTHER): Payer: 59

## 2021-01-06 VITALS — BP 110/68 | HR 86 | Temp 98.1°F | Ht 60.0 in | Wt 122.8 lb

## 2021-01-06 DIAGNOSIS — Z1231 Encounter for screening mammogram for malignant neoplasm of breast: Secondary | ICD-10-CM | POA: Diagnosis not present

## 2021-01-06 DIAGNOSIS — Z23 Encounter for immunization: Secondary | ICD-10-CM | POA: Diagnosis not present

## 2021-01-06 DIAGNOSIS — G8929 Other chronic pain: Secondary | ICD-10-CM

## 2021-01-06 DIAGNOSIS — M79641 Pain in right hand: Secondary | ICD-10-CM | POA: Diagnosis not present

## 2021-01-06 DIAGNOSIS — E119 Type 2 diabetes mellitus without complications: Secondary | ICD-10-CM | POA: Diagnosis not present

## 2021-01-06 DIAGNOSIS — M79644 Pain in right finger(s): Secondary | ICD-10-CM | POA: Diagnosis not present

## 2021-01-06 LAB — COMPREHENSIVE METABOLIC PANEL
ALT: 22 U/L (ref 0–35)
AST: 16 U/L (ref 0–37)
Albumin: 4.3 g/dL (ref 3.5–5.2)
Alkaline Phosphatase: 70 U/L (ref 39–117)
BUN: 12 mg/dL (ref 6–23)
CO2: 27 mEq/L (ref 19–32)
Calcium: 9.2 mg/dL (ref 8.4–10.5)
Chloride: 101 mEq/L (ref 96–112)
Creatinine, Ser: 0.49 mg/dL (ref 0.40–1.20)
GFR: 102.99 mL/min (ref >60.00)
Glucose, Bld: 275 mg/dL — ABNORMAL HIGH (ref 70–99)
Potassium: 3.7 mEq/L (ref 3.5–5.1)
Sodium: 137 mEq/L (ref 135–145)
Total Bilirubin: 0.8 mg/dL (ref 0.2–1.2)
Total Protein: 6.6 g/dL (ref 6.0–8.3)

## 2021-01-06 LAB — POCT GLYCOSYLATED HEMOGLOBIN (HGB A1C): Hemoglobin A1C: 11.7 % — AB (ref 4.0–5.6)

## 2021-01-06 LAB — CBC WITH DIFFERENTIAL/PLATELET
Basophils Absolute: 0 10*3/uL (ref 0.0–0.1)
Basophils Relative: 0.9 % (ref 0.0–3.0)
Eosinophils Absolute: 0.1 10*3/uL (ref 0.0–0.7)
Eosinophils Relative: 2.3 % (ref 0.0–5.0)
HCT: 42.5 % (ref 36.0–46.0)
Hemoglobin: 14.6 g/dL (ref 12.0–15.0)
Lymphocytes Relative: 23.4 % (ref 12.0–46.0)
Lymphs Abs: 1.1 10*3/uL (ref 0.7–4.0)
MCHC: 34.5 g/dL (ref 30.0–36.0)
MCV: 87.7 fl (ref 78.0–100.0)
Monocytes Absolute: 0.4 10*3/uL (ref 0.1–1.0)
Monocytes Relative: 8.2 % (ref 3.0–12.0)
Neutro Abs: 3 10*3/uL (ref 1.4–7.7)
Neutrophils Relative %: 65.2 % (ref 43.0–77.0)
Platelets: 174 10*3/uL (ref 150.0–400.0)
RBC: 4.84 Mil/uL (ref 3.87–5.11)
RDW: 12.8 % (ref 11.5–15.5)
WBC: 4.6 10*3/uL (ref 4.0–10.5)

## 2021-01-06 LAB — LIPID PANEL
Cholesterol: 105 mg/dL (ref 0–200)
HDL: 68.9 mg/dL (ref >39.00)
LDL Cholesterol: 32 mg/dL (ref 0–99)
NonHDL: 36.53
Total CHOL/HDL Ratio: 2
Triglycerides: 23 mg/dL (ref 0.0–149.0)
VLDL: 4.6 mg/dL (ref 0.0–40.0)

## 2021-01-06 LAB — TSH: TSH: 1.31 u[IU]/mL (ref 0.35–5.50)

## 2021-01-06 LAB — C-REACTIVE PROTEIN: CRP: <1 mg/dL (ref 0.5–20.0)

## 2021-01-06 LAB — MICROALBUMIN / CREATININE URINE RATIO
Creatinine,U: 70.4 mg/dL
Microalb Creat Ratio: 2.9 mg/g (ref 0.0–30.0)
Microalb, Ur: 2.1 mg/dL — ABNORMAL HIGH (ref 0.0–1.9)

## 2021-01-06 LAB — SEDIMENTATION RATE: Sed Rate: 4 mm/hr (ref 0–30)

## 2021-01-06 LAB — VITAMIN D 25 HYDROXY (VIT D DEFICIENCY, FRACTURES): VITD: 22.23 ng/mL — ABNORMAL LOW (ref 30.00–100.00)

## 2021-01-06 MED ORDER — METFORMIN HCL ER 500 MG PO TB24
ORAL_TABLET | ORAL | 3 refills | Status: DC
  Filled 2021-01-06: qty 90, 90d supply, fill #0
  Filled 2021-02-20: qty 90, 90d supply, fill #1

## 2021-01-06 NOTE — Assessment & Plan Note (Addendum)
New, presentation supports osteoarthritis. Benign exam today.   Pending inflammatory markers, x-ray.  Advised trial of over-the-counter Voltaren gel and ice.  Close follow-up in 1 month. Discuss rheumatology versus dermatology for skin rash.

## 2021-01-06 NOTE — Progress Notes (Signed)
Subjective:    Patient ID: Rachael Weeks, female    DOB: [birth year], 60 y.o.   MRN: [id number]  CC: Rachael Weeks is a 60 y.o. female who presents today for follow up to reestablish care after last visit 04/2017  HPI: Right base of thumb of pain x 3 weeks, improved. No pain today.  She is right handed. She works as Charity fundraiser.  She also enjoys quilting No injury, swelling, erythema, fever.  She reports chronic dry patches of rash on her elbows.  She has tried heat without relief. she is never had a formal diagnosis of psoriasis.  She is not on any medications at this time.   DM- FBG 130-140 at home.  She denies polydipsia, polyphagia, blurry vision.    Husband is in hospice and she is primary caregiver for husband.  She is coping well with caring for him.  She is sleeping well.  No anxiety or depression.   she endorses dietary indiscretion with sweets.   Due mammogram.        HISTORY:  Past Medical History:  Diagnosis Date   Chicken pox    Diabetes mellitus without complication ([place])    Kidney stones    Past Surgical History:  Procedure Laterality Date   BREAST BIOPSY Right 1983   benign   BREAST SURGERY     INNER EAR SURGERY     Family History  Problem Relation Age of Onset   Hypertension Mother    Cancer Father        Prostate   Hyperlipidemia Father    Stroke Father    Hypertension Father    Diabetes Father    Diabetes Sister    Hyperlipidemia Brother    Stroke Brother    Hypertension Brother    Diabetes Brother    Diabetes Paternal Grandfather    Breast cancer Paternal Aunt 78   Diabetes Son    Diabetes Sister    Diabetes Brother    Diabetes Brother    Colon cancer Neg Hx     Allergies: Patient has no known allergies. Current Outpatient Medications on File Prior to Visit  Medication Sig Dispense Refill   glucose blood test strip 1 each by Other route 4 (four) times daily. Use to test blood sugar twice daily. (Patient not taking: Reported on  01/06/2021) 100 each 3   Lancet Devices (B-D LANCET DEVICE) MISC Inject 1 application into the skin 4 (four) times daily. (Patient not taking: Reported on 01/06/2021) 100 each 3   No current facility-administered medications on file prior to visit.    Social History   Tobacco Use   Smoking status: Former    Packs/day: 1.00    Years: 15.00    Pack years: 15.00    Types: Cigarettes    Quit date: 09/08/2008    Years since quitting: 12.3   Smokeless tobacco: Never  Vaping Use   Vaping Use: Never used  Substance Use Topics   Alcohol use: No   Drug use: No    Review of Systems  Constitutional:  Negative for chills and fever.  Eyes:  Negative for visual disturbance.  Respiratory:  Negative for cough.   Cardiovascular:  Negative for chest pain and palpitations.  Gastrointestinal:  Negative for nausea and vomiting.  Musculoskeletal:  Positive for arthralgias. Negative for joint swelling.  Skin:  Positive for rash (elbows).     Objective:    BP 110/68 (BP Location: Left Arm, Patient Position: Sitting, Cuff Size:  Normal)   Pulse 86   Temp 98.1 F (36.7 C) (Oral)   Ht 5' (1.524 m)   Wt 122 lb 12.8 oz (55.7 kg)   SpO2 99%   BMI 23.98 kg/m  BP Readings from Last 3 Encounters:  01/06/21 110/68  01/16/20 (!) 121/59  05/05/17 126/67   Wt Readings from Last 3 Encounters:  01/06/21 122 lb 12.8 oz (55.7 kg)  01/16/20 137 lb (62.1 kg)  05/05/17 127 lb (57.6 kg)    Physical Exam Vitals reviewed.  Constitutional:      Appearance: She is well-developed.  Eyes:     Conjunctiva/sclera: Conjunctivae normal.  Cardiovascular:     Rate and Rhythm: Normal rate and regular rhythm.     Pulses: Normal pulses.     Heart sounds: Normal heart sounds.  Pulmonary:     Effort: Pulmonary effort is normal.     Breath sounds: Normal breath sounds. No wheezing, rhonchi or rales.  Musculoskeletal:     Right wrist: Normal. No swelling or tenderness. Normal range of motion.     Left wrist: Normal.  No swelling or tenderness. Normal range of motion.     Comments: Bilaterally palpable radial pulses.  Sensation intact bilateral hands.  Grip strength normal.  No tenderness right base of thumb.  No erythema or joint swelling. Dry patches noted bilateral elbows.   Skin:    General: Skin is warm and dry.  Neurological:     Mental Status: She is alert.  Psychiatric:        Speech: Speech normal.        Behavior: Behavior normal.        Thought Content: Thought content normal.       Assessment & Plan:   Problem List Items Addressed This Visit       Endocrine   Diabetes mellitus without complication ([place]) - Primary    Lab Results  Component Value Date   HGBA1C 11.7 (A) 01/06/2021  Severely uncontrolled.  Will resume metformin and she will titrate to maximum 2000 mg/day.  She will call with readings of fasting blood glucose readings every 2 weeks.  Counseled on low glycemic diet.  Follow-up in 1 month.  Very likely will start GLP-1 agonist at follow-up      Relevant Medications   metFORMIN (GLUCOPHAGE XR) 500 MG 24 hr tablet   Other Relevant Orders   POCT HgB A1C (Completed)   Lipid panel   Microalbumin / creatinine urine ratio   Comprehensive metabolic panel   TSH   VITAMIN D 25 Hydroxy (Vit-D Deficiency, Fractures)     Other   Chronic pain of right thumb    New, presentation supports osteoarthritis. Benign exam today.   Pending inflammatory markers, x-ray.  Advised trial of over-the-counter Voltaren gel and ice.  Close follow-up in 1 month. Discuss rheumatology versus dermatology for skin rash.        Relevant Orders   CBC with Differential/Platelet   ANA   C-reactive protein   CYCLIC CITRUL PEPTIDE ANTIBODY, IGG/IGA   Rheumatoid factor   Sedimentation rate   DG Hand Complete Right   Other Visit Diagnoses     Encounter for screening mammogram for malignant neoplasm of breast       Relevant Orders   MM 3D SCREEN BREAST BILATERAL   Need for Tdap vaccination        Relevant Orders   Tdap vaccine greater than or equal to 7yo IM (Completed)  I have discontinued Bexlie H. Winzer's metFORMIN, estradiol, glipiZIDE, empagliflozin, meloxicam, and cyclobenzaprine. I am also having her start on metFORMIN. Additionally, I am having her maintain her glucose blood and B-D LANCET DEVICE.   Meds ordered this encounter  Medications   metFORMIN (GLUCOPHAGE XR) 500 MG 24 hr tablet    Sig: Start '500mg'$  PO qpm.    Dispense:  90 tablet    Refill:  3    Order Specific Question:   Supervising Provider    Answer:   Crecencio Mc [[id number]]     Return precautions given.   Risks, benefits, and alternatives of the medications and treatment plan prescribed today were discussed, and patient expressed understanding.   Education regarding symptom management and diagnosis given to patient on AVS.  Continue to follow with Burnard Hawthorne, FNP for routine health maintenance.   Romie Levee Kantor and I agreed with plan.   Mable Paris, FNP

## 2021-01-06 NOTE — Patient Instructions (Addendum)
Trial of over-the-counter Voltaren gel for right thumb with ice.  orthopedics consult if pain were to worsen.  Lets trial metformin  Start metformin XR with one '500mg'$  tablet at night. After one week, you may increase to two tablets at night ( total of '1000mg'$ ) . The third week, you may take take two tablets at night and one tablet in the morning.  The fourth week, you may take two tablets in the morning ( '1000mg'$  total) and two tablets at night ('1000mg'$  total). This will bring you to a maximum daily dose of '2000mg'$ /day which is maximum dose. Along the way, if you want to increase more slowly, please do as this medication can cause GI discomfort and loose stools which usually get better with time , however some patients find that they can only tolerate a certain dose and cannot increase to maximum dose.    This is  Dr. Lupita Dawn  example of a  "Low GI"  Diet:  It will allow you to lose 4 to 8  lbs  per month if you follow it carefully.  Your goal with exercise is a minimum of 30 minutes of aerobic exercise 5 days per week (Walking does not count once it becomes easy!)    All of the foods can be found at grocery stores and in bulk at [employer].  The Atkins protein bars and shakes are available in more varieties at [employer], [employer] and [employer].     7 AM Breakfast:  Choose from the following:  Low carbohydrate Protein  Shakes (I recommend the  Premier Protein chocolate shakes,  EAS AdvantEdge "Carb Control" shakes  Or the Atkins shakes all are under 3 net carbs)     a scrambled egg/bacon/cheese burrito made with Mission's "carb balance" whole wheat tortilla  (about 10 net carbs )  Regulatory affairs officer (basically a quiche without the pastry crust) that is eaten cold and very convenient way to get your eggs.  8 carbs)  If you make your own protein shakes, avoid bananas and pineapple,  And use low carb greek yogurt or original /unsweetened almond or soy milk    Avoid cereal and bananas,  oatmeal and cream of wheat and grits. They are loaded with carbohydrates!   10 AM: high protein snack:  Protein bar by Atkins (the snack size, under 200 cal, usually < 6 net carbs).    A stick of cheese:  Around 1 carb,  100 cal     Dannon Light n Fit [place] Yogurt  (80 cal, 8 carbs)  Other so called "protein bars" and Greek yogurts tend to be loaded with carbohydrates.  Remember, in food advertising, the word "energy" is synonymous for " carbohydrate."  Lunch:   A Sandwich using the bread choices listed, Can use any  Eggs,  lunchmeat, grilled meat or canned tuna), avocado, regular mayo/mustard  and cheese.  A Salad using blue cheese, ranch,  Goddess or vinagrette,  Avoid taco shells, croutons or "confetti" and no "candied nuts" but regular nuts OK.   No pretzels, nabs  or chips.  Pickles and miniature sweet peppers are a good low carb alternative that provide a "crunch"  The bread is the only source of carbohydrate in a sandwich and  can be decreased by trying some of the attached alternatives to traditional loaf bread   Avoid "Low fat dressings, as well as Barry Brunner and [employer] dressings They are loaded with sugar!   3 PM/ Mid  day  Snack:  Consider  1 ounce of  almonds, walnuts, pistachios, pecans, peanuts,  Macadamia nuts or a nut medley.  Avoid "granola and granola bars "  Mixed nuts are ok in moderation as long as there are no raisins,  cranberries or dried fruit.   KIND bars are OK if you get the low glycemic index variety   Try the prosciutto/mozzarella cheese sticks by [employer]  In deli /backery section   High protein      6 PM  Dinner:     Meat/fowl/fish with a green salad, and either broccoli, cauliflower, green beans, spinach, brussel sprouts or  Lima beans. DO NOT BREAD THE PROTEIN!!      There is a low carb pasta by Dreamfield's that is acceptable and tastes great: only 5 digestible carbs/serving.( All grocery stores but BJs carry it ) Several ready made meals are  available low carb:   Try Michel Angelo's chicken piccata or chicken or eggplant parm over low carb pasta.(Lowes and BJs)   Marjory Lies Sanchez's "Carnitas" (pulled pork, no sauce,  0 carbs) or his beef pot roast to make a dinner burrito (at [employer]J's)  Pesto over low carb pasta (bj's sells a good quality pesto in the center refrigerated section of the deli   Try satueeing  Cheral Marker with mushroooms as a good side   [employer] makes a mashed cauliflower that tastes like mashed potatoes  Whole wheat pasta is still full of digestible carbs and  Not as low in glycemic index as Dreamfield's.   Brown rice is still rice,  So skip the rice and noodles if you eat [place] or [place] (or at least limit to 1/2 cup)  9 PM snack :   Breyer's "low carb" fudgsicle or  ice cream bar (Carb Smart line), or  Weight Watcher's ice cream bar , or another "no sugar added" ice cream;  a serving of fresh berries/cherries with whipped cream   Cheese or DANNON'S LlGHT N FIT GREEK YOGURT  8 ounces of [place] unsweetened almond/cococunut milk    Treat yourself to a parfait made with whipped cream blueberiies, walnuts and vanilla greek yogurt  Avoid bananas, pineapple, grapes  and watermelon on a regular basis because they are high in sugar.  THINK OF THEM AS DESSERT  Remember that snack Substitutions should be less than 10 NET carbs per serving and meals < 20 carbs. Remember to subtract fiber grams to get the "net carbs."

## 2021-01-06 NOTE — Assessment & Plan Note (Addendum)
Lab Results  Component Value Date   HGBA1C 11.7 (A) 01/06/2021   Severely uncontrolled.  Will resume metformin and she will titrate to maximum 2000 mg/day.  She will call with readings of fasting blood glucose readings every 2 weeks.  Counseled on low glycemic diet.  Follow-up in 1 month.  Very likely will start GLP-1 agonist at follow-up

## 2021-01-07 ENCOUNTER — Other Ambulatory Visit: Payer: Self-pay

## 2021-01-07 ENCOUNTER — Encounter: Payer: Self-pay | Admitting: Family

## 2021-01-07 MED ORDER — FREESTYLE FREEDOM LITE W/DEVICE KIT
1.0000 | PACK | Freq: Every day | 0 refills | Status: DC
  Filled 2021-01-07: qty 1, 1d supply, fill #0

## 2021-01-07 MED ORDER — FREESTYLE LITE TEST VI STRP
ORAL_STRIP | 5 refills | Status: DC
  Filled 2021-01-07: qty 100, 50d supply, fill #0
  Filled 2021-04-07: qty 100, 50d supply, fill #1
  Filled 2021-07-08: qty 100, 50d supply, fill #2
  Filled 2021-09-04: qty 100, 50d supply, fill #3
  Filled 2021-11-02: qty 100, 50d supply, fill #4
  Filled 2021-12-19: qty 100, 50d supply, fill #5

## 2021-01-07 MED ORDER — FREESTYLE LANCETS MISC
5 refills | Status: AC
  Filled 2021-01-07: qty 100, 50d supply, fill #0

## 2021-01-08 ENCOUNTER — Other Ambulatory Visit: Payer: Self-pay

## 2021-01-09 LAB — ANTI-NUCLEAR AB-TITER (ANA TITER): ANA Titer 1: 1:80 {titer} — ABNORMAL HIGH

## 2021-01-09 LAB — RHEUMATOID FACTOR: Rhuematoid fact SerPl-aCnc: <14 IU/mL (ref <14)

## 2021-01-09 LAB — ANA: Anti Nuclear Antibody (ANA): POSITIVE — AB

## 2021-01-09 LAB — CYCLIC CITRUL PEPTIDE ANTIBODY, IGG/IGA: Cyclic Citrullin Peptide Ab: 6 units (ref 0–19)

## 2021-01-10 ENCOUNTER — Other Ambulatory Visit: Payer: Self-pay

## 2021-01-13 ENCOUNTER — Other Ambulatory Visit: Payer: Self-pay

## 2021-01-13 ENCOUNTER — Ambulatory Visit
Admission: RE | Admit: 2021-01-13 | Discharge: 2021-01-13 | Disposition: A | Discharge status: Home / Self Care | Payer: 59 | Source: Ambulatory Visit | Attending: Family | Admitting: Family

## 2021-01-13 DIAGNOSIS — Z1231 Encounter for screening mammogram for malignant neoplasm of breast: Secondary | ICD-10-CM | POA: Insufficient documentation

## 2021-01-14 ENCOUNTER — Other Ambulatory Visit: Payer: Self-pay | Admitting: Family

## 2021-01-14 ENCOUNTER — Ambulatory Visit: Payer: Self-pay | Admitting: Family Medicine

## 2021-01-14 ENCOUNTER — Encounter: Payer: Self-pay | Admitting: Family

## 2021-01-14 DIAGNOSIS — G8929 Other chronic pain: Secondary | ICD-10-CM

## 2021-01-15 ENCOUNTER — Other Ambulatory Visit: Payer: Self-pay

## 2021-01-15 ENCOUNTER — Other Ambulatory Visit: Payer: Self-pay | Admitting: Family

## 2021-01-15 DIAGNOSIS — E119 Type 2 diabetes mellitus without complications: Secondary | ICD-10-CM

## 2021-01-15 MED ORDER — LOSARTAN POTASSIUM 25 MG PO TABS
12.5000 mg | ORAL_TABLET | Freq: Every day | ORAL | 1 refills | Status: DC
  Filled 2021-01-15: qty 45, 90d supply, fill #0
  Filled 2021-07-08: qty 45, 90d supply, fill #1
  Filled 2021-11-02: qty 45, 90d supply, fill #2

## 2021-01-15 NOTE — Telephone Encounter (Signed)
For your information  

## 2021-01-16 ENCOUNTER — Other Ambulatory Visit: Payer: Self-pay

## 2021-01-21 ENCOUNTER — Other Ambulatory Visit: Payer: Self-pay

## 2021-01-21 DIAGNOSIS — H2513 Age-related nuclear cataract, bilateral: Secondary | ICD-10-CM | POA: Diagnosis not present

## 2021-01-21 DIAGNOSIS — E113591 Type 2 diabetes mellitus with proliferative diabetic retinopathy without macular edema, right eye: Secondary | ICD-10-CM | POA: Diagnosis not present

## 2021-01-21 DIAGNOSIS — H2511 Age-related nuclear cataract, right eye: Secondary | ICD-10-CM | POA: Diagnosis not present

## 2021-01-21 DIAGNOSIS — H18413 Arcus senilis, bilateral: Secondary | ICD-10-CM | POA: Diagnosis not present

## 2021-01-21 DIAGNOSIS — H25013 Cortical age-related cataract, bilateral: Secondary | ICD-10-CM | POA: Diagnosis not present

## 2021-01-21 DIAGNOSIS — E113392 Type 2 diabetes mellitus with moderate nonproliferative diabetic retinopathy without macular edema, left eye: Secondary | ICD-10-CM | POA: Diagnosis not present

## 2021-01-21 DIAGNOSIS — H4311 Vitreous hemorrhage, right eye: Secondary | ICD-10-CM | POA: Diagnosis not present

## 2021-01-21 DIAGNOSIS — H25043 Posterior subcapsular polar age-related cataract, bilateral: Secondary | ICD-10-CM | POA: Diagnosis not present

## 2021-01-21 MED ORDER — DIFLUPREDNATE 0.05 % OP EMUL
OPHTHALMIC | 1 refills | Status: DC
  Filled 2021-01-21: qty 5, 28d supply, fill #0

## 2021-01-21 MED ORDER — PROLENSA 0.07 % OP SOLN
OPHTHALMIC | 1 refills | Status: DC
  Filled 2021-01-21: qty 3, 28d supply, fill #0

## 2021-01-21 MED ORDER — BESIVANCE 0.6 % OP SUSP
OPHTHALMIC | 1 refills | Status: DC
  Filled 2021-01-21: qty 5, 28d supply, fill #0

## 2021-01-23 ENCOUNTER — Other Ambulatory Visit: Payer: Self-pay

## 2021-01-23 ENCOUNTER — Other Ambulatory Visit (INDEPENDENT_AMBULATORY_CARE_PROVIDER_SITE_OTHER): Payer: 59

## 2021-01-23 DIAGNOSIS — E119 Type 2 diabetes mellitus without complications: Secondary | ICD-10-CM

## 2021-01-23 LAB — BASIC METABOLIC PANEL
BUN: 8 mg/dL (ref 6–23)
CO2: 28 mEq/L (ref 19–32)
Calcium: 9.3 mg/dL (ref 8.4–10.5)
Chloride: 103 mEq/L (ref 96–112)
Creatinine, Ser: 0.56 mg/dL (ref 0.40–1.20)
GFR: 99.7 mL/min (ref >60.00)
Glucose, Bld: 246 mg/dL — ABNORMAL HIGH (ref 70–99)
Potassium: 3.6 mEq/L (ref 3.5–5.1)
Sodium: 138 mEq/L (ref 135–145)

## 2021-02-07 ENCOUNTER — Other Ambulatory Visit: Payer: Self-pay

## 2021-02-07 ENCOUNTER — Encounter: Payer: Self-pay | Admitting: Family

## 2021-02-07 ENCOUNTER — Ambulatory Visit: Payer: 59 | Admitting: Family

## 2021-02-07 VITALS — BP 124/72 | HR 76 | Ht 60.0 in | Wt 122.8 lb

## 2021-02-07 DIAGNOSIS — E119 Type 2 diabetes mellitus without complications: Secondary | ICD-10-CM

## 2021-02-07 MED ORDER — OZEMPIC (0.25 OR 0.5 MG/DOSE) 2 MG/1.5ML ~~LOC~~ SOPN
0.2500 mg | PEN_INJECTOR | SUBCUTANEOUS | 3 refills | Status: DC
  Filled 2021-02-07: qty 1.5, 30d supply, fill #0
  Filled 2021-04-07: qty 1.5, 30d supply, fill #1
  Filled 2021-06-03: qty 1.5, 30d supply, fill #2
  Filled 2021-07-08: qty 1.5, 30d supply, fill #3
  Filled 2021-09-04: qty 1.5, 30d supply, fill #4
  Filled 2021-10-13: qty 1.5, 30d supply, fill #5

## 2021-02-07 NOTE — Patient Instructions (Signed)
Start ozempic 0.25mg once per week After 4 weeks, and if tolerated, please increase to 0.5mg   Please read information on medication below and remember black box warning that you may not take if you or a family member is diagnosed with thyroid cancer.    Semaglutide injection solution What is this medicine? SEMAGLUTIDE (Sem a GLOO tide) is used to improve blood sugar control in adults with type 2 diabetes. This medicine may be used with other diabetes medicines. This drug may also reduce the risk of heart attack or stroke if you have type 2 diabetes and risk factors for heart disease. This medicine may be used for other purposes; ask your health care provider or pharmacist if you have questions. COMMON BRAND NAME(S): OZEMPIC What should I tell my health care provider before I take this medicine? They need to know if you have any of these conditions:  endocrine tumors (MEN 2) or if someone in your family had these tumors  eye disease, vision problems  history of pancreatitis  kidney disease  stomach problems  thyroid cancer or if someone in your family had thyroid cancer  an unusual or allergic reaction to semaglutide, other medicines, foods, dyes, or preservatives  pregnant or trying to get pregnant  breast-feeding How should I use this medicine? This medicine is for injection under the skin of your upper leg (thigh), stomach area, or upper arm. It is given once every week (every 7 days). You will be taught how to prepare and give this medicine. Use exactly as directed. Take your medicine at regular intervals. Do not take it more often than directed. If you use this medicine with insulin, you should inject this medicine and the insulin separately. Do not mix them together. Do not give the injections right next to each other. Change (rotate) injection sites with each injection. It is important that you put your used needles and syringes in a special sharps container. Do not put them  in a trash can. If you do not have a sharps container, call your pharmacist or healthcare provider to get one. A special MedGuide will be given to you by the pharmacist with each prescription and refill. Be sure to read this information carefully each time. This drug comes with INSTRUCTIONS FOR USE. Ask your pharmacist for directions on how to use this drug. Read the information carefully. Talk to your pharmacist or health care provider if you have questions. Talk to your pediatrician regarding the use of this medicine in children. Special care may be needed. Overdosage: If you think you have taken too much of this medicine contact a poison control center or emergency room at once. NOTE: This medicine is only for you. Do not share this medicine with others. What if I miss a dose? If you miss a dose, take it as soon as you can within 5 days after the missed dose. Then take your next dose at your regular weekly time. If it has been longer than 5 days after the missed dose, do not take the missed dose. Take the next dose at your regular time. Do not take double or extra doses. If you have questions about a missed dose, contact your health care provider for advice. What may interact with this medicine?  other medicines for diabetes Many medications may cause changes in blood sugar, these include:  alcohol containing beverages  antiviral medicines for HIV or AIDS  aspirin and aspirin-like drugs  certain medicines for blood pressure, heart disease, irregular   heart beat  chromium  diuretics  female hormones, such as estrogens or progestins, birth control pills  fenofibrate  gemfibrozil  isoniazid  lanreotide  female hormones or anabolic steroids  MAOIs like Carbex, Eldepryl, Marplan, Nardil, and Parnate  medicines for weight loss  medicines for allergies, asthma, cold, or cough  medicines for depression, anxiety, or psychotic disturbances  niacin  nicotine  NSAIDs, medicines  for pain and inflammation, like ibuprofen or naproxen  octreotide  pasireotide  pentamidine  phenytoin  probenecid  quinolone antibiotics such as ciprofloxacin, levofloxacin, ofloxacin  some herbal dietary supplements  steroid medicines such as prednisone or cortisone  sulfamethoxazole; trimethoprim  thyroid hormones Some medications can hide the warning symptoms of low blood sugar (hypoglycemia). You may need to monitor your blood sugar more closely if you are taking one of these medications. These include:  beta-blockers, often used for high blood pressure or heart problems (examples include atenolol, metoprolol, propranolol)  clonidine  guanethidine  reserpine This list may not describe all possible interactions. Give your health care provider a list of all the medicines, herbs, non-prescription drugs, or dietary supplements you use. Also tell them if you smoke, drink alcohol, or use illegal drugs. Some items may interact with your medicine. What should I watch for while using this medicine? Visit your doctor or health care professional for regular checks on your progress. Drink plenty of fluids while taking this medicine. Check with your doctor or health care professional if you get an attack of severe diarrhea, nausea, and vomiting. The loss of too much body fluid can make it dangerous for you to take this medicine. A test called the HbA1C (A1C) will be monitored. This is a simple blood test. It measures your blood sugar control over the last 2 to 3 months. You will receive this test every 3 to 6 months. Learn how to check your blood sugar. Learn the symptoms of low and high blood sugar and how to manage them. Always carry a quick-source of sugar with you in case you have symptoms of low blood sugar. Examples include hard sugar candy or glucose tablets. Make sure others know that you can choke if you eat or drink when you develop serious symptoms of low blood sugar, such as  seizures or unconsciousness. They must get medical help at once. Tell your doctor or health care professional if you have high blood sugar. You might need to change the dose of your medicine. If you are sick or exercising more than usual, you might need to change the dose of your medicine. Do not skip meals. Ask your doctor or health care professional if you should avoid alcohol. Many nonprescription cough and cold products contain sugar or alcohol. These can affect blood sugar. Pens should never be shared. Even if the needle is changed, sharing may result in passing of viruses like hepatitis or HIV. Wear a medical ID bracelet or chain, and carry a card that describes your disease and details of your medicine and dosage times. Do not become pregnant while taking this medicine. Women should inform their doctor if they wish to become pregnant or think they might be pregnant. There is a potential for serious side effects to an unborn child. Talk to your health care professional or pharmacist for more information. What side effects may I notice from receiving this medicine? Side effects that you should report to your doctor or health care professional as soon as possible:  allergic reactions like skin rash, itching or   hives, swelling of the face, lips, or tongue  breathing problems  changes in vision  diarrhea that continues or is severe  lump or swelling on the neck  severe nausea  signs and symptoms of infection like fever or chills; cough; sore throat; pain or trouble passing urine  signs and symptoms of low blood sugar such as feeling anxious, confusion, dizziness, increased hunger, unusually weak or tired, sweating, shakiness, cold, irritable, headache, blurred vision, fast heartbeat, loss of consciousness  signs and symptoms of kidney injury like trouble passing urine or change in the amount of urine  trouble swallowing  unusual stomach upset or pain  vomiting Side effects that  usually do not require medical attention (report to your doctor or health care professional if they continue or are bothersome):  constipation  diarrhea  nausea  pain, redness, or irritation at site where injected  stomach upset This list may not describe all possible side effects. Call your doctor for medical advice about side effects. You may report side effects to FDA at 1-800-FDA-1088. Where should I keep my medicine? Keep out of the reach of children. Store unopened pens in a refrigerator between 2 and 8 degrees C (36 and 46 degrees F). Do not freeze. Protect from light and heat. After you first use the pen, it can be stored for 56 days at room temperature between 15 and 30 degrees C (59 and 86 degrees F) or in a refrigerator. Throw away your used pen after 56 days or after the expiration date, whichever comes first. Do not store your pen with the needle attached. If the needle is left on, medicine may leak from the pen. NOTE: This sheet is a summary. It may not cover all possible information. If you have questions about this medicine, talk to your doctor, pharmacist, or health care provider.  2021 Elsevier/Gold Standard (2019-01-31 09:41:51)  

## 2021-02-07 NOTE — Assessment & Plan Note (Signed)
FBG < 200. Control improving. Continue metformin '2000mg'$ /day. Start ozempic 0.'25mg'$ .  Counseled on FDA black box replacement cancer,MEN.

## 2021-02-07 NOTE — Progress Notes (Signed)
Subjective:    Patient ID: Rachael Weeks, female    DOB: [birth year], 60 y.o.   MRN: [id number]  CC: Rachael Weeks is a 60 y.o. female who presents today for follow up.   HPI: Feels well today No complaints  DM-resumed metformin to 2000 mg a day. FBG on average 170-195.  No vision changes, polyuria.   Started losartan 12.41m  in the setting of proteinuria  Right thumb pain-positive ANA . Awaiting on referral to rheumatology  No personal or family h/o thyroid cancer    HISTORY:  Past Medical History:  Diagnosis Date   Chicken pox    Diabetes mellitus without complication ([place]    Kidney stones    Past Surgical History:  Procedure Laterality Date   BREAST BIOPSY Right 1983   benign   BREAST SURGERY     INNER EAR SURGERY     Family History  Problem Relation Age of Onset   Hypertension Mother    Cancer Father        Prostate   Hyperlipidemia Father    Stroke Father    Hypertension Father    Diabetes Father    Diabetes Sister    Hyperlipidemia Brother    Stroke Brother    Hypertension Brother    Diabetes Brother    Diabetes Paternal Grandfather    Breast cancer Paternal AAunt 75  Diabetes Son    Diabetes Sister    Diabetes Brother    Diabetes Brother    Colon cancer Neg Hx     Allergies: Patient has no known allergies. Current Outpatient Medications on File Prior to Visit  Medication Sig Dispense Refill   BESIVANCE 0.6 % SUSP Instill 1 drop into right eye three times a day as directed 5 mL 1   Blood Glucose Monitoring Suppl (FREESTYLE FREEDOM LITE) w/Device KIT 1 each by Does not apply route daily. 1 kit 0   Difluprednate 0.05 % EMUL PLACE 1 DROP INTO RIGHT EYE 3 TIMES A DAY AS DIRECTED 5 mL 1   glucose blood (FREESTYLE LITE) test strip USED TO CHECK BLOOD GLUCOSE 2 TIMES A DAY. 100 each 5   Lancet Devices (B-D LANCET DEVICE) MISC Inject 1 application into the skin 4 (four) times daily. 100 each 3   Lancets (FREESTYLE) lancets USED TO CHECK BLOOD GLUCOSE  LEVELS 2 TIMES A DAY. 100 each 5   losartan (COZAAR) 25 MG tablet Take 0.5 tablets (12.5 mg total) by mouth daily. 90 tablet 1   metFORMIN (GLUCOPHAGE XR) 500 MG 24 hr tablet Start 5039mPO qpm. 90 tablet 3   PROLENSA 0.07 % SOLN Instill 1 drop into right eye every evening as directed 3 mL 1   No current facility-administered medications on file prior to visit.    Social History   Tobacco Use   Smoking status: Former    Packs/day: 1.00    Years: 15.00    Pack years: 15.00    Types: Cigarettes    Quit date: 09/08/2008    Years since quitting: 12.4   Smokeless tobacco: Never  Vaping Use   Vaping Use: Never used  Substance Use Topics   Alcohol use: No   Drug use: No    Review of Systems  Constitutional:  Negative for chills and fever.  Respiratory:  Negative for cough.   Cardiovascular:  Negative for chest pain and palpitations.  Gastrointestinal:  Negative for nausea and vomiting.     Objective:    BP  124/72   Pulse 76   Ht 5' (1.524 m)   Wt 122 lb 12.8 oz (55.7 kg)   SpO2 98%   BMI 23.98 kg/m  BP Readings from Last 3 Encounters:  02/07/21 124/72  01/06/21 110/68  01/16/20 (!) 121/59   Wt Readings from Last 3 Encounters:  02/07/21 122 lb 12.8 oz (55.7 kg)  01/06/21 122 lb 12.8 oz (55.7 kg)  01/16/20 137 lb (62.1 kg)    Physical Exam Vitals reviewed.  Constitutional:      Appearance: She is well-developed.  Eyes:     Conjunctiva/sclera: Conjunctivae normal.  Neck:     Thyroid: No thyroid mass, thyromegaly or thyroid tenderness.  Cardiovascular:     Rate and Rhythm: Normal rate and regular rhythm.     Pulses: Normal pulses.     Heart sounds: Normal heart sounds.  Pulmonary:     Effort: Pulmonary effort is normal.     Breath sounds: Normal breath sounds. No wheezing, rhonchi or rales.  Skin:    General: Skin is warm and dry.  Neurological:     Mental Status: She is alert.  Psychiatric:        Speech: Speech normal.        Behavior: Behavior normal.         Thought Content: Thought content normal.       Assessment & Plan:   Problem List Items Addressed This Visit       Endocrine   DM (diabetes mellitus), type 2 with renal complications ([place]) - Primary    FBG < 200. Control improving. Continue metformin 2068m/day. Start ozempic 0.216m  Counseled on FDA black box replacement cancer,MEN.       Relevant Medications   Semaglutide,0.25 or 0.5MG/DOS, (OZEMPIC, 0.25 OR 0.5 MG/DOSE,) 2 MG/1.5ML SOPN     I am having Rachael Weeks start on Ozempic (0.25 or 0.5 MG/DOSE). I am also having her maintain her B-D LANCET DEVICE, metFORMIN, FREESTYLE LITE, FreeStyle Freedom Lite, freestyle, losartan, Besivance, Difluprednate, and Prolensa.   Meds ordered this encounter  Medications   Semaglutide,0.25 or 0.5MG/DOS, (OZEMPIC, 0.25 OR 0.5 MG/DOSE,) 2 MG/1.5ML SOPN    Sig: Inject 0.25 mg into the skin once a week. After 4 weeks, increase to 0.2m23mc qwk.    Dispense:  3 mL    Refill:  3    Order Specific Question:   Supervising Provider    Answer:   TULCrecencio [id number]]    Return precautions given.   Risks, benefits, and alternatives of the medications and treatment plan prescribed today were discussed, and patient expressed understanding.   Education regarding symptom management and diagnosis given to patient on AVS.  Continue to follow with ArnBurnard HawthorneNP for routine health maintenance.   CynRomie Leveex and I agreed with plan.   MarMable ParisNP

## 2021-02-17 DIAGNOSIS — H4311 Vitreous hemorrhage, right eye: Secondary | ICD-10-CM | POA: Diagnosis not present

## 2021-02-17 DIAGNOSIS — H2511 Age-related nuclear cataract, right eye: Secondary | ICD-10-CM | POA: Diagnosis not present

## 2021-02-17 DIAGNOSIS — E113511 Type 2 diabetes mellitus with proliferative diabetic retinopathy with macular edema, right eye: Secondary | ICD-10-CM | POA: Diagnosis not present

## 2021-02-18 ENCOUNTER — Other Ambulatory Visit: Payer: Self-pay

## 2021-02-18 DIAGNOSIS — H31091 Other chorioretinal scars, right eye: Secondary | ICD-10-CM | POA: Diagnosis not present

## 2021-02-18 DIAGNOSIS — H35411 Lattice degeneration of retina, right eye: Secondary | ICD-10-CM | POA: Diagnosis not present

## 2021-02-20 ENCOUNTER — Other Ambulatory Visit: Payer: Self-pay

## 2021-02-20 ENCOUNTER — Encounter: Payer: Self-pay | Admitting: Family

## 2021-02-20 DIAGNOSIS — E119 Type 2 diabetes mellitus without complications: Secondary | ICD-10-CM

## 2021-02-25 ENCOUNTER — Other Ambulatory Visit: Payer: Self-pay

## 2021-02-25 MED ORDER — METFORMIN HCL ER 500 MG PO TB24
1000.0000 mg | ORAL_TABLET | Freq: Two times a day (BID) | ORAL | 1 refills | Status: DC
  Filled 2021-02-25 (×2): qty 90, 45d supply, fill #0
  Filled 2021-02-28: qty 180, 45d supply, fill #0
  Filled 2021-11-02: qty 180, 45d supply, fill #1

## 2021-02-25 NOTE — Telephone Encounter (Signed)
Patients medication has been updated Per Mable Paris. Metformin has been changed to Metformin XR 1000mg  BID.

## 2021-02-26 DIAGNOSIS — E113511 Type 2 diabetes mellitus with proliferative diabetic retinopathy with macular edema, right eye: Secondary | ICD-10-CM | POA: Diagnosis not present

## 2021-02-26 DIAGNOSIS — H4312 Vitreous hemorrhage, left eye: Secondary | ICD-10-CM | POA: Diagnosis not present

## 2021-02-28 ENCOUNTER — Other Ambulatory Visit: Payer: Self-pay

## 2021-03-07 ENCOUNTER — Other Ambulatory Visit: Payer: Self-pay

## 2021-03-10 ENCOUNTER — Other Ambulatory Visit: Payer: Self-pay

## 2021-03-10 DIAGNOSIS — Z111 Encounter for screening for respiratory tuberculosis: Secondary | ICD-10-CM | POA: Diagnosis not present

## 2021-03-10 DIAGNOSIS — M654 Radial styloid tenosynovitis [de Quervain]: Secondary | ICD-10-CM | POA: Diagnosis not present

## 2021-03-10 DIAGNOSIS — L400 Psoriasis vulgaris: Secondary | ICD-10-CM | POA: Diagnosis not present

## 2021-03-10 DIAGNOSIS — Z7960 Long term (current) use of unspecified immunomodulators and immunosuppressants: Secondary | ICD-10-CM | POA: Diagnosis not present

## 2021-03-10 DIAGNOSIS — L4050 Arthropathic psoriasis, unspecified: Secondary | ICD-10-CM | POA: Diagnosis not present

## 2021-03-10 MED ORDER — METHOTREXATE 2.5 MG PO TABS
ORAL_TABLET | ORAL | 2 refills | Status: DC
  Filled 2021-03-10: qty 24, 28d supply, fill #0
  Filled 2021-04-07: qty 24, 28d supply, fill #1
  Filled 2021-05-05: qty 24, 28d supply, fill #2

## 2021-03-10 MED ORDER — FOLIC ACID 1 MG PO TABS
ORAL_TABLET | ORAL | 3 refills | Status: DC
  Filled 2021-03-10: qty 90, 90d supply, fill #0
  Filled 2021-07-08: qty 90, 90d supply, fill #1
  Filled 2021-11-02: qty 90, 90d supply, fill #2
  Filled 2022-02-03: qty 90, 90d supply, fill #3

## 2021-03-18 ENCOUNTER — Other Ambulatory Visit: Payer: Self-pay

## 2021-03-18 DIAGNOSIS — E113511 Type 2 diabetes mellitus with proliferative diabetic retinopathy with macular edema, right eye: Secondary | ICD-10-CM | POA: Diagnosis not present

## 2021-03-18 DIAGNOSIS — H31091 Other chorioretinal scars, right eye: Secondary | ICD-10-CM | POA: Diagnosis not present

## 2021-03-18 DIAGNOSIS — H4311 Vitreous hemorrhage, right eye: Secondary | ICD-10-CM | POA: Diagnosis not present

## 2021-03-18 DIAGNOSIS — H2512 Age-related nuclear cataract, left eye: Secondary | ICD-10-CM | POA: Diagnosis not present

## 2021-03-18 LAB — HM DIABETES EYE EXAM

## 2021-03-18 MED ORDER — KETOROLAC TROMETHAMINE 0.5 % OP SOLN
OPHTHALMIC | 1 refills | Status: DC
  Filled 2021-03-18: qty 3, 9d supply, fill #0
  Filled 2021-05-09: qty 5, 18d supply, fill #1
  Filled 2021-07-08: qty 5, 18d supply, fill #2

## 2021-03-18 MED ORDER — PREDNISOLONE ACETATE 1 % OP SUSP
OPHTHALMIC | 1 refills | Status: DC
  Filled 2021-03-18: qty 5, 22d supply, fill #0
  Filled 2021-05-09: qty 5, 18d supply, fill #1

## 2021-03-18 MED ORDER — MOXIFLOXACIN HCL 0.5 % OP SOLN
OPHTHALMIC | 1 refills | Status: DC
  Filled 2021-03-18: qty 3, 9d supply, fill #0

## 2021-03-31 DIAGNOSIS — H2510 Age-related nuclear cataract, unspecified eye: Secondary | ICD-10-CM | POA: Diagnosis not present

## 2021-03-31 DIAGNOSIS — H2512 Age-related nuclear cataract, left eye: Secondary | ICD-10-CM | POA: Diagnosis not present

## 2021-03-31 DIAGNOSIS — H25012 Cortical age-related cataract, left eye: Secondary | ICD-10-CM | POA: Diagnosis not present

## 2021-03-31 DIAGNOSIS — Z9841 Cataract extraction status, right eye: Secondary | ICD-10-CM | POA: Diagnosis not present

## 2021-04-07 ENCOUNTER — Other Ambulatory Visit: Payer: Self-pay

## 2021-04-08 ENCOUNTER — Other Ambulatory Visit: Payer: Self-pay

## 2021-04-08 DIAGNOSIS — L4050 Arthropathic psoriasis, unspecified: Secondary | ICD-10-CM | POA: Diagnosis not present

## 2021-04-08 DIAGNOSIS — Z7960 Long term (current) use of unspecified immunomodulators and immunosuppressants: Secondary | ICD-10-CM | POA: Diagnosis not present

## 2021-04-08 DIAGNOSIS — L400 Psoriasis vulgaris: Secondary | ICD-10-CM | POA: Diagnosis not present

## 2021-04-08 DIAGNOSIS — M654 Radial styloid tenosynovitis [de Quervain]: Secondary | ICD-10-CM | POA: Diagnosis not present

## 2021-04-08 MED ORDER — METHOTREXATE 2.5 MG PO TABS
ORAL_TABLET | ORAL | 2 refills | Status: DC
  Filled 2021-04-08 – 2021-06-03 (×2): qty 32, 28d supply, fill #0
  Filled 2021-07-08: qty 32, 28d supply, fill #1
  Filled 2021-09-04: qty 32, 28d supply, fill #2

## 2021-04-18 ENCOUNTER — Other Ambulatory Visit: Payer: Self-pay

## 2021-04-18 DIAGNOSIS — E113591 Type 2 diabetes mellitus with proliferative diabetic retinopathy without macular edema, right eye: Secondary | ICD-10-CM | POA: Diagnosis not present

## 2021-04-18 DIAGNOSIS — H43822 Vitreomacular adhesion, left eye: Secondary | ICD-10-CM | POA: Diagnosis not present

## 2021-04-18 DIAGNOSIS — H59031 Cystoid macular edema following cataract surgery, right eye: Secondary | ICD-10-CM | POA: Diagnosis not present

## 2021-04-18 DIAGNOSIS — E113412 Type 2 diabetes mellitus with severe nonproliferative diabetic retinopathy with macular edema, left eye: Secondary | ICD-10-CM | POA: Diagnosis not present

## 2021-04-18 MED ORDER — KETOROLAC TROMETHAMINE 0.5 % OP SOLN
OPHTHALMIC | 1 refills | Status: DC
  Filled 2021-04-18: qty 5, 18d supply, fill #0
  Filled 2021-06-03: qty 5, 18d supply, fill #1

## 2021-04-18 MED ORDER — PREDNISOLONE ACETATE 1 % OP SUSP
OPHTHALMIC | 1 refills | Status: DC
  Filled 2021-04-18: qty 5, 18d supply, fill #0

## 2021-05-05 ENCOUNTER — Other Ambulatory Visit: Payer: Self-pay

## 2021-05-06 ENCOUNTER — Other Ambulatory Visit: Payer: Self-pay

## 2021-05-06 DIAGNOSIS — E113412 Type 2 diabetes mellitus with severe nonproliferative diabetic retinopathy with macular edema, left eye: Secondary | ICD-10-CM | POA: Diagnosis not present

## 2021-05-06 DIAGNOSIS — E113511 Type 2 diabetes mellitus with proliferative diabetic retinopathy with macular edema, right eye: Secondary | ICD-10-CM | POA: Diagnosis not present

## 2021-05-06 MED ORDER — TOBRAMYCIN 0.3 % OP SOLN
OPHTHALMIC | 5 refills | Status: DC
  Filled 2021-05-06: qty 5, 7d supply, fill #0

## 2021-05-09 ENCOUNTER — Other Ambulatory Visit: Payer: Self-pay

## 2021-05-09 ENCOUNTER — Ambulatory Visit (INDEPENDENT_AMBULATORY_CARE_PROVIDER_SITE_OTHER): Payer: 59 | Admitting: Family

## 2021-05-09 ENCOUNTER — Encounter: Payer: Self-pay | Admitting: Family

## 2021-05-09 VITALS — BP 130/60 | HR 80 | Temp 98.4°F | Ht 60.0 in | Wt 118.6 lb

## 2021-05-09 DIAGNOSIS — Z23 Encounter for immunization: Secondary | ICD-10-CM

## 2021-05-09 DIAGNOSIS — R809 Proteinuria, unspecified: Secondary | ICD-10-CM

## 2021-05-09 DIAGNOSIS — E1129 Type 2 diabetes mellitus with other diabetic kidney complication: Secondary | ICD-10-CM

## 2021-05-09 DIAGNOSIS — G6289 Other specified polyneuropathies: Secondary | ICD-10-CM

## 2021-05-09 DIAGNOSIS — G629 Polyneuropathy, unspecified: Secondary | ICD-10-CM | POA: Insufficient documentation

## 2021-05-09 LAB — POCT GLYCOSYLATED HEMOGLOBIN (HGB A1C): Hemoglobin A1C: 7.7 % — AB (ref 4.0–5.6)

## 2021-05-09 MED ORDER — GABAPENTIN 100 MG PO CAPS
100.0000 mg | ORAL_CAPSULE | Freq: Every evening | ORAL | 3 refills | Status: DC
  Filled 2021-05-09: qty 90, 90d supply, fill #0
  Filled 2021-11-02: qty 90, 90d supply, fill #1
  Filled 2022-02-03: qty 90, 90d supply, fill #2

## 2021-05-09 NOTE — Assessment & Plan Note (Addendum)
Lab Results  Component Value Date   HGBA1C 7.7 (A) 05/09/2021   Uncontrolled, however improved.   Patient has not been entirely compliant with metformin.  She will start taking metformin 2000 mg at the same time to aid in compliance.  She will continue Ozempic 0.5 mg we may opt to increase if A1c does not approach 6.5 at follow-up.  She is taking losartan 25 mg for renal protection.  Pending microalbumin.  LDL of 32 not on statin.  CT calcium score in 2018 was 0.  Will continue to discuss statin therapy and consider moderate potency ( crestor 5mg )  in setting of DM and no known history of CAD. I have sent a staff message to Dr Rockey Situ, cardiology as well to further get advise about statin therapy.

## 2021-05-09 NOTE — Progress Notes (Signed)
Subjective:    Patient ID: Rachael Weeks, female    DOB: [birth year], 60 y.o.   MRN: [id number]  CC: Rachael Weeks is a 60 y.o. female who presents today for follow up.   HPI: Husband has end stage cancer and is currently in hospice.  Sleep is interrupted by leg and feet pain, tingling  Complains of bilateral calves in toe bilaterally.   No falls , leg swelling, injury, low back pain, groin pain, saddle anesthesia, trouble urinating or having a bowel movement.     DM-she is not compliant metformin 2014m/day, compliant with ozempic 0.599m  HTN-compliant with losartan 25 mg she is not on statin. Ct calcium score 0 in 2018 with Dr GoRockey SituDue [id number]   Following with rheumatology for psoriatic arthritis HISTORY:  Past Medical History:  Diagnosis Date   Chicken pox    Di[place] complication ([place]   Kidney stones    Past Surgical History:  Procedure Laterality Date   BREAST BIOPSY Right 1983   benign   BREAST SURGERY     INNER EAR SURGERY     Family History  Problem Relation Age of Onset   Hypertension Mother    Cancer Father        Prostate   Hyperlipidemia Father    Stroke Father    Hypertension Father    Diabetes Father    Diabetes Sister    Diabetes Sister    Hyperlipidemia Brother    Stroke Brother    Hypertension Brother    Diabetes Brother    Diabetes Brother    Diabetes Brother    Diabetes Paternal Grandfather    Diabetes Son    Breast cancer Paternal Aunt [id number] Colon cancer Neg Hx    Thyroid cancer Neg Hx     Allergies: Patient has no known allergies. Current Outpatient Medications on File Prior to Visit  Medication Sig Dispense Refill   Blood Glucose Monitoring Suppl (FREESTYLE FREEDOM LITE) w/Device KIT 1 each by Does not apply route daily. 1 kit 0   folic acid (FOLVITE) 1 MG tablet Take 1 tablet (1 mg total) by mouth once daily 90 tablet 3   glucose blood (FREESTYLE LITE) test strip USED TO CHECK BLOOD GLUCOSE 2 TIMES A DAY.  100 each 5   ketorolac (ACULAR) 0.5 % ophthalmic solution Apply one drop into the left eye 4 times a day as directed 5 mL 1   ketorolac (ACULAR) 0.5 % ophthalmic solution Instill 1 drop in right eye four times a day 5 mL 1   Lancet Devices (B-D LANCET DEVICE) MISC Inject 1 application into the skin 4 (four) times daily. 100 each 3   Lancets (FREESTYLE) lancets USED TO CHECK BLOOD GLUCOSE LEVELS 2 TIMES A DAY. 100 each 5   losartan (COZAAR) 25 MG tablet Take 0.5 tablets (12.5 mg total) by mouth daily. 90 tablet 1   metFORMIN (GLUCOPHAGE-XR) 500 MG 24 hr tablet Take 2 tablets (1,000 mg total) by mouth 2 (two) times daily. 180 tablet 1   methotrexate (RHEUMATREX) 2.5 MG tablet Take 6 tablets (15 mg total) by mouth every 7 (seven) days all on the same day 24 tablet 2   methotrexate (RHEUMATREX) 2.5 MG tablet Take 8 tablets (20 mg total) by mouth every 7 (seven) days All on the same day 32 tablet 2   moxifloxacin (VIGAMOX) 0.5 % ophthalmic solution Apply one drop into left eye 4 times a day as directed 5  mL 1   prednisoLONE acetate (PRED FORTE) 1 % ophthalmic suspension Apply 1 drop into left eye four times a day as directed. Shake well before each use! 5 mL 1   prednisoLONE acetate (PRED FORTE) 1 % ophthalmic suspension Instill 1 drop in right eye four times a day 5 mL 1   Semaglutide,0.25 or 0.5MG/DOS, (OZEMPIC, 0.25 OR 0.5 MG/DOSE,) 2 MG/1.5ML SOPN Inject 0.25 mg into the skin once a week. After 4 weeks, increase to 0.64m [place] qwk. 3 mL 3   tobramycin (TOBREX) 0.3 % ophthalmic solution Instill 1 drop in both eyes four times a day; Begin one day prior to procedure, use day of procedure, one day after then stop 5 mL 5   No current facility-administered medications on file prior to visit.    Social History   Tobacco Use   Smoking status: Former    Packs/day: 1.00    Years: 15.00    Pack years: 15.00    Types: Cigarettes    Quit date: 09/08/2008    Years since quitting: 12.6   Smokeless tobacco:  Never  Vaping Use   Vaping Use: Never used  Substance Use Topics   Alcohol use: No   Drug use: No    Review of Systems  Constitutional:  Negative for chills and fever.  Respiratory:  Negative for cough.   Cardiovascular:  Negative for chest pain and palpitations.  Gastrointestinal:  Negative for nausea and vomiting.  Musculoskeletal:  Negative for back pain.  Neurological:  Positive for numbness.  Psychiatric/Behavioral:  Positive for sleep disturbance.      Objective:    BP 130/60 (BP Location: Left Arm, Patient Position: Sitting, Cuff Size: Normal)   Pulse 80   Temp 98.4 F (36.9 C) (Oral)   Ht 5' (1.524 m)   Wt 118 lb 9.6 oz (53.8 kg)   BMI 23.16 kg/m  BP Readings from Last 3 Encounters:  05/09/21 130/60  02/07/21 124/72  01/06/21 110/68   Wt Readings from Last 3 Encounters:  05/09/21 118 lb 9.6 oz (53.8 kg)  02/07/21 122 lb 12.8 oz (55.7 kg)  01/06/21 122 lb 12.8 oz (55.7 kg)    Physical Exam Vitals reviewed.  Constitutional:      Appearance: She is well-developed.  Eyes:     Conjunctiva/sclera: Conjunctivae normal.  Cardiovascular:     Rate and Rhythm: Normal rate and regular rhythm.     Pulses: Normal pulses.     Heart sounds: Normal heart sounds.     Comments: Warm extremities, bilateral brisk pedal pulses Pulmonary:     Effort: Pulmonary effort is normal.     Breath sounds: Normal breath sounds. No wheezing, rhonchi or rales.  Musculoskeletal:     Lumbar back: No tenderness or bony tenderness. Normal range of motion.  Skin:    General: Skin is warm and dry.  Neurological:     Mental Status: She is alert.     Comments: Sensation intact bilaterally LE.   Psychiatric:        Speech: Speech normal.        Behavior: Behavior normal.        Thought Content: Thought content normal.       Assessment & Plan:   Problem List Items Addressed This Visit       Endocrine   DM (diabetes mellitus), type [place] complications [place]    Lab  Results  Component Value Date   HGBA1C 7.7 (A) 05/09/2021  Uncontrolled, however improved.   Patient has not been entirely compliant with metformin.  She will start taking metformin 2000 mg at the same time to aid in compliance.  She will continue Ozempic 0.5 mg we may opt to increase if A1c does not approach 6.5 at follow-up.  She is taking losartan 25 mg for renal protection.  Pending microalbumin.  LDL of 32 not on statin.  CT calcium score in 2018 was 0.  Will continue to discuss statin therapy and consider moderate potency ( crestor 106m)  in setting of DM and no known history of CAD. I have sent a staff message to Dr GRockey Situ cardiology as well to further get advise about statin therapy.       Relevant Orders   Microalbumin / creatinine urine ratio   CBC with Differential/Platelet   Comprehensive metabolic panel   POCT HgB A1C (Completed)     Nervous and Auditory   Peripheral neuropathy    Uncontrolled.  Affecting her sleep at bedtime.  I suspect related to longstanding, previously uncontrolled diabetes.  Pending B12..  No low back pain however we discussed continuing to monitor and pursue back imaging if symptoms were to persist, worsen.  Start gabapentin 100 mg taken at bedtime.  Close follow up      Relevant Medications   gabapentin (NEURONTIN) 100 MG capsule   Other Relevant Orders   B12 and Folate Panel   Other Visit Diagnoses     Need for pneumococcal vaccination       Relevant Orders   Pneumococcal conjugate vaccine 20-valent (Prevnar 20) (Completed)        I have discontinued CRomie Levee Stearns's Besivance, Difluprednate, and Prolensa. I am also having her start on gabapentin. Additionally, I am having her maintain her B-D LANCET DEVICE, FREESTYLE LITE, FreeStyle Freedom Lite, freestyle, losartan, Ozempic (0.25 or 0.5 MG/DOSE), metFORMIN, folic acid, methotrexate, ketorolac, prednisoLONE acetate, moxifloxacin, methotrexate, ketorolac, prednisoLONE acetate, and  tobramycin.   Meds ordered this encounter  Medications   gabapentin (NEURONTIN) 100 MG capsule    Sig: Take 1 capsule (100 mg total) by mouth every evening.    Dispense:  90 capsule    Refill:  3    Order Specific Question:   Supervising Provider    Answer:   TCrecencio [id number]]     Return precautions given.   Risks, benefits, and alternatives of the medications and treatment plan prescribed today were discussed, and patient expressed understanding.   Education regarding symptom management and diagnosis given to patient on AVS.  Continue to follow with ABurnard Hawthorne FNP for routine health maintenance.   CRomie LeveeCox and I agreed with plan.   MMable Paris FNP

## 2021-05-09 NOTE — Assessment & Plan Note (Addendum)
Uncontrolled.  Affecting her sleep at bedtime.  I suspect related to longstanding, previously uncontrolled diabetes.  Pending B12..  No low back pain however we discussed continuing to monitor and pursue back imaging if symptoms were to persist, worsen.  Start gabapentin 100 mg taken at bedtime.  Close follow up

## 2021-05-09 NOTE — Patient Instructions (Signed)
To be more compliant, take metformin 2000mg  ( 4 tablets) all together to make easier  Trial gabapentin for leg pain

## 2021-05-10 LAB — CBC WITH DIFFERENTIAL/PLATELET
Absolute Monocytes: 427 cells/uL (ref 200–950)
Basophils Absolute: 40 cells/uL (ref 0–200)
Basophils Relative: 0.9 %
Eosinophils Absolute: 110 cells/uL (ref 15–500)
Eosinophils Relative: 2.5 %
HCT: 39.4 % (ref 35.0–45.0)
Hemoglobin: 14.1 g/dL (ref 11.7–15.5)
Lymphs Abs: 1346 cells/uL (ref 850–3900)
MCH: 31.1 pg (ref 27.0–33.0)
MCHC: 35.8 g/dL (ref 32.0–36.0)
MCV: 86.8 fL (ref 80.0–100.0)
MPV: 10.8 fL (ref 7.5–12.5)
Monocytes Relative: 9.7 %
Neutro Abs: 2477 cells/uL (ref 1500–7800)
Neutrophils Relative %: 56.3 %
Platelets: 226 10*3/uL (ref 140–400)
RBC: 4.54 10*6/uL (ref 3.80–5.10)
RDW: 12.8 % (ref 11.0–15.0)
Total Lymphocyte: 30.6 %
WBC: 4.4 10*3/uL (ref 3.8–10.8)

## 2021-05-10 LAB — COMPREHENSIVE METABOLIC PANEL
AG Ratio: 1.7 (calc) (ref 1.0–2.5)
ALT: 16 U/L (ref 6–29)
AST: 14 U/L (ref 10–35)
Albumin: 4.1 g/dL (ref 3.6–5.1)
Alkaline phosphatase (APISO): 50 U/L (ref 37–153)
BUN/Creatinine Ratio: 24 (calc) — ABNORMAL HIGH (ref 6–22)
BUN: 11 mg/dL (ref 7–25)
CO2: 28 mmol/L (ref 20–32)
Calcium: 10.2 mg/dL (ref 8.6–10.4)
Chloride: 102 mmol/L (ref 98–110)
Creat: 0.45 mg/dL — ABNORMAL LOW (ref 0.50–1.03)
Globulin: 2.4 g/dL (calc) (ref 1.9–3.7)
Glucose, Bld: 192 mg/dL — ABNORMAL HIGH (ref 65–99)
Potassium: 4.2 mmol/L (ref 3.5–5.3)
Sodium: 140 mmol/L (ref 135–146)
Total Bilirubin: 0.5 mg/dL (ref 0.2–1.2)
Total Protein: 6.5 g/dL (ref 6.1–8.1)

## 2021-05-10 LAB — B12 AND FOLATE PANEL
Folate: >24 ng/mL
Vitamin B-12: 234 pg/mL (ref 200–1100)

## 2021-05-10 LAB — MICROALBUMIN / CREATININE URINE RATIO
Creatinine, Urine: 96 mg/dL (ref 20–275)
Microalb Creat Ratio: 9 mcg/mg creat (ref <30)
Microalb, Ur: 0.9 mg/dL

## 2021-05-12 ENCOUNTER — Other Ambulatory Visit: Payer: Self-pay | Admitting: Family

## 2021-05-12 DIAGNOSIS — E538 Deficiency of other specified B group vitamins: Secondary | ICD-10-CM

## 2021-05-28 DIAGNOSIS — E113412 Type 2 diabetes mellitus with severe nonproliferative diabetic retinopathy with macular edema, left eye: Secondary | ICD-10-CM | POA: Diagnosis not present

## 2021-05-28 DIAGNOSIS — E113511 Type 2 diabetes mellitus with proliferative diabetic retinopathy with macular edema, right eye: Secondary | ICD-10-CM | POA: Diagnosis not present

## 2021-05-30 ENCOUNTER — Telehealth: Payer: Self-pay | Admitting: Family

## 2021-05-30 DIAGNOSIS — E1129 Type 2 diabetes mellitus with other diabetic kidney complication: Secondary | ICD-10-CM

## 2021-05-30 NOTE — Telephone Encounter (Signed)
Call pt Consulted with dr Rockey Situ whom advised based on your very low cholesterol , he didn't recommend a statin  He did advise a CT cardiac score every 5-10 years; your last one was in 2018.    Please ensure you put a reminder on your phone to call to request this test in a couple of years which is done by cardiology

## 2021-05-30 NOTE — Telephone Encounter (Signed)
Pt called and notified of this. She also scheduled her first month of B-12 injections.

## 2021-05-30 NOTE — Telephone Encounter (Signed)
-----   Message from Minna Merritts, MD sent at 05/26/2021  9:29 AM EST ----- With numbers that low, I dont think I would treat with statin. Perhaps periodic calcium scoring, maybe every 5-10 years or so. A1C much better, good for her! Thx TGollan   ----- Message ----- From: Burnard Hawthorne, FNP Sent: 05/09/2021   4:41 PM EST To: Minna Merritts, MD  Dr Rockey Situ Sorry to bug you! You saw this patient 4 years and she had a very  low risk CT calcium score 0 at that time.  My question is your advice about statin therapy in setting of DM and no known history of CAD.   Her LDL cholesterol is excellent 32 and she is currently not on statin.   I had thought about a high potency such as crestor 5mg  as thought no harm in setting of DM, inflammatory disease ( psoriatic arthritis).  Do you have a strong feeling?  Happy to refer her to you as well   Joycelyn Schmid

## 2021-06-03 ENCOUNTER — Other Ambulatory Visit: Payer: Self-pay

## 2021-06-03 ENCOUNTER — Ambulatory Visit (INDEPENDENT_AMBULATORY_CARE_PROVIDER_SITE_OTHER): Payer: 59

## 2021-06-03 DIAGNOSIS — E538 Deficiency of other specified B group vitamins: Secondary | ICD-10-CM

## 2021-06-03 MED ORDER — CYANOCOBALAMIN 1000 MCG/ML IJ SOLN
1000.0000 ug | Freq: Once | INTRAMUSCULAR | Status: AC
  Administered 2021-06-03: 1000 ug via INTRAMUSCULAR

## 2021-06-03 NOTE — Progress Notes (Signed)
Pt here for first b12 injection. Given in L deltoid. Tolerated well. No complaints or concerns at this time.

## 2021-06-10 ENCOUNTER — Other Ambulatory Visit: Payer: 59

## 2021-06-10 ENCOUNTER — Other Ambulatory Visit: Payer: Self-pay

## 2021-06-10 ENCOUNTER — Ambulatory Visit (INDEPENDENT_AMBULATORY_CARE_PROVIDER_SITE_OTHER): Payer: 59 | Admitting: *Deleted

## 2021-06-10 ENCOUNTER — Ambulatory Visit: Payer: 59

## 2021-06-10 DIAGNOSIS — L400 Psoriasis vulgaris: Secondary | ICD-10-CM | POA: Diagnosis not present

## 2021-06-10 DIAGNOSIS — E538 Deficiency of other specified B group vitamins: Secondary | ICD-10-CM

## 2021-06-10 DIAGNOSIS — Z7960 Long term (current) use of unspecified immunomodulators and immunosuppressants: Secondary | ICD-10-CM | POA: Diagnosis not present

## 2021-06-10 DIAGNOSIS — L4050 Arthropathic psoriasis, unspecified: Secondary | ICD-10-CM | POA: Diagnosis not present

## 2021-06-10 MED ORDER — CYANOCOBALAMIN 1000 MCG/ML IJ SOLN
1000.0000 ug | Freq: Once | INTRAMUSCULAR | Status: AC
  Administered 2021-06-10: 1000 ug via INTRAMUSCULAR

## 2021-06-10 NOTE — Progress Notes (Signed)
Patient presented for B 12 injection to right deltoid, patient voiced no concerns nor showed any signs of distress during injection. 

## 2021-06-17 ENCOUNTER — Other Ambulatory Visit: Payer: Self-pay

## 2021-06-17 ENCOUNTER — Other Ambulatory Visit: Payer: 59

## 2021-06-17 ENCOUNTER — Ambulatory Visit (INDEPENDENT_AMBULATORY_CARE_PROVIDER_SITE_OTHER): Payer: 59

## 2021-06-17 DIAGNOSIS — E538 Deficiency of other specified B group vitamins: Secondary | ICD-10-CM | POA: Diagnosis not present

## 2021-06-17 MED ORDER — CYANOCOBALAMIN 1000 MCG/ML IJ SOLN
1000.0000 ug | Freq: Once | INTRAMUSCULAR | Status: AC
  Administered 2021-06-17: 1000 ug via INTRAMUSCULAR

## 2021-06-17 NOTE — Progress Notes (Signed)
Patient came into office today for B-12 injection in left deltoid IM. Patient tolerated well with no signs of distress.

## 2021-06-19 LAB — CELIAC DISEASE AB SCREEN W/RFX
Antigliadin Abs, IgA: 6 units (ref 0–19)
IgA/Immunoglobulin A, Serum: 206 mg/dL (ref 87–352)
Transglutaminase IgA: <2 U/mL (ref 0–3)

## 2021-06-19 LAB — SPECIMEN STATUS REPORT

## 2021-06-20 LAB — INTRINSIC FACTOR ANTIBODIES: Intrinsic Factor: NEGATIVE

## 2021-06-20 LAB — METHYLMALONIC ACID, SERUM: Methylmalonic Acid, Quant: 117 nmol/L (ref 87–318)

## 2021-06-20 LAB — HOMOCYSTEINE: Homocysteine: 6.3 umol/L (ref <10.4)

## 2021-06-20 LAB — ANTI-PARIETAL ANTIBODY: PARIETAL CELL AB SCREEN: NEGATIVE

## 2021-06-24 ENCOUNTER — Ambulatory Visit (INDEPENDENT_AMBULATORY_CARE_PROVIDER_SITE_OTHER): Payer: 59 | Admitting: *Deleted

## 2021-06-24 ENCOUNTER — Other Ambulatory Visit: Payer: Self-pay

## 2021-06-24 DIAGNOSIS — E538 Deficiency of other specified B group vitamins: Secondary | ICD-10-CM | POA: Diagnosis not present

## 2021-06-24 MED ORDER — CYANOCOBALAMIN 1000 MCG/ML IJ SOLN
1000.0000 ug | Freq: Once | INTRAMUSCULAR | Status: AC
  Administered 2021-06-24: 17:00:00 1000 ug via INTRAMUSCULAR

## 2021-06-24 NOTE — Progress Notes (Signed)
Patient presented for B 12 injection to right deltoid, patient voiced no concerns nor showed any signs of distress during injection. 

## 2021-06-25 DIAGNOSIS — E113591 Type 2 diabetes mellitus with proliferative diabetic retinopathy without macular edema, right eye: Secondary | ICD-10-CM | POA: Diagnosis not present

## 2021-06-25 DIAGNOSIS — E113492 Type 2 diabetes mellitus with severe nonproliferative diabetic retinopathy without macular edema, left eye: Secondary | ICD-10-CM | POA: Diagnosis not present

## 2021-06-25 DIAGNOSIS — H43823 Vitreomacular adhesion, bilateral: Secondary | ICD-10-CM | POA: Diagnosis not present

## 2021-06-25 LAB — HM DIABETES EYE EXAM

## 2021-07-08 ENCOUNTER — Other Ambulatory Visit: Payer: Self-pay

## 2021-07-09 ENCOUNTER — Other Ambulatory Visit: Payer: Self-pay

## 2021-07-11 ENCOUNTER — Encounter: Payer: Self-pay | Admitting: Family

## 2021-07-11 ENCOUNTER — Other Ambulatory Visit: Payer: Self-pay

## 2021-07-11 ENCOUNTER — Ambulatory Visit (INDEPENDENT_AMBULATORY_CARE_PROVIDER_SITE_OTHER): Payer: 59 | Admitting: Family

## 2021-07-11 ENCOUNTER — Other Ambulatory Visit (HOSPITAL_COMMUNITY)
Admission: RE | Admit: 2021-07-11 | Discharge: 2021-07-11 | Disposition: A | Discharge status: Home / Self Care | Payer: 59 | Source: Ambulatory Visit | Attending: Family | Admitting: Family

## 2021-07-11 VITALS — BP 98/60 | HR 85 | Ht 60.0 in | Wt 116.0 lb

## 2021-07-11 DIAGNOSIS — E1129 Type 2 diabetes mellitus with other diabetic kidney complication: Secondary | ICD-10-CM | POA: Diagnosis not present

## 2021-07-11 DIAGNOSIS — R809 Proteinuria, unspecified: Secondary | ICD-10-CM | POA: Diagnosis not present

## 2021-07-11 DIAGNOSIS — Z0000 Encounter for general adult medical examination without abnormal findings: Secondary | ICD-10-CM

## 2021-07-11 DIAGNOSIS — H9202 Otalgia, left ear: Secondary | ICD-10-CM | POA: Diagnosis not present

## 2021-07-11 DIAGNOSIS — E538 Deficiency of other specified B group vitamins: Secondary | ICD-10-CM

## 2021-07-11 MED ORDER — AMOXICILLIN-POT CLAVULANATE 875-125 MG PO TABS
1.0000 | ORAL_TABLET | Freq: Two times a day (BID) | ORAL | 0 refills | Status: AC
  Filled 2021-07-11: qty 14, 7d supply, fill #0

## 2021-07-11 MED ORDER — "BD LUER-LOK SYRINGE 25G X 5/8"" 1 ML MISC"
1 refills | Status: AC
  Filled 2021-07-11 – 2022-02-03 (×4): qty 50, fill #0

## 2021-07-11 MED ORDER — CYANOCOBALAMIN 1000 MCG/ML IJ SOLN
1000.0000 ug | Freq: Once | INTRAMUSCULAR | Status: AC
  Administered 2021-07-11: 1000 ug via INTRAMUSCULAR

## 2021-07-11 MED ORDER — "BD ECLIPSE NEEDLE 25G X 1"" MISC"
1 refills | Status: AC
  Filled 2021-07-11 – 2022-02-03 (×4): qty 50, fill #0

## 2021-07-11 MED ORDER — CYANOCOBALAMIN 1000 MCG/ML IJ SOLN
INTRAMUSCULAR | 3 refills | Status: DC
  Filled 2021-07-11: qty 3, 90d supply, fill #0
  Filled 2021-11-02: qty 3, 90d supply, fill #1
  Filled 2022-02-03: qty 3, 90d supply, fill #2
  Filled 2022-04-29: qty 3, 90d supply, fill #3

## 2021-07-11 NOTE — Progress Notes (Signed)
Subjective:    Patient ID: Rachael Weeks, female    DOB: [birth year], 61 y.o.   MRN: [id number]  CC: Rachael Weeks is a 61 y.o. female who presents today for physical exam and follow up.    HPI: Complains congestion x 7 days, improved.  Left ear pain and pressure started yesterday.  She is taking flonase without relief.   No fever, sob, ear drainage, HA, vision changes  H/o left ear tumor, s/p surgery with ENT.   She has trouble staying asleep most nights.   NO depression, anxiety No si/hi   B12 deficiency- fatigue has improved. She would like RN at work to give.   DM - compliant with Ozempic 0.5 mg, metformin 2000 mg. FBG 127.   Colorectal Cancer Screening: due Breast Cancer Screening: Mammogram UTD Cervical Cancer Screening: due Bone Health screening/DEXA for 65+: No increased fracture risk. Defer screening at this time.  Lung Cancer Screening: Doesn't have 20 year pack year history and age > 97 years yo 9 years        Tetanus - UTD        Pneumococcal - Complete  Labs: Screening labs UTD in the last 6 months.  Exercise: Gets regular exercise.   Alcohol use:  none Smoking/tobacco use: former smoker, quit 13 years ago   HISTORY:  Past Medical History:  Diagnosis Date   Chicken pox    Diabetes mellitus without complication ([place])    Kidney stones     Past Surgical History:  Procedure Laterality Date   BREAST BIOPSY Right 1983   benign   BREAST SURGERY     INNER EAR SURGERY     Family History  Problem Relation Age of Onset   Hypertension Mother    Cancer Father        Prostate   Hyperlipidemia Father    Stroke Father    Hypertension Father    Diabetes Father    Diabetes Sister    Diabetes Sister    Hyperlipidemia Brother    Stroke Brother    Hypertension Brother    Diabetes Brother    Diabetes Brother    Diabetes Brother    Diabetes Paternal Grandfather    Diabetes Son    Breast cancer Paternal Aunt 61   Colon cancer Neg Hx    Thyroid cancer  Neg Hx       ALLERGIES: Patient has no known allergies.  Current Outpatient Medications on File Prior to Visit  Medication Sig Dispense Refill   Blood Glucose Monitoring Suppl (FREESTYLE FREEDOM LITE) w/Device KIT 1 each by Does not apply route daily. 1 kit 0   folic acid (FOLVITE) 1 MG tablet Take 1 tablet (1 mg total) by mouth once daily 90 tablet 3   gabapentin (NEURONTIN) 100 MG capsule Take 1 capsule (100 mg total) by mouth every evening. 90 capsule 3   glucose blood (FREESTYLE LITE) test strip USED TO CHECK BLOOD GLUCOSE 2 TIMES A DAY. 100 each 5   ketorolac (ACULAR) 0.5 % ophthalmic solution Apply one drop into the left eye 4 times a day as directed 5 mL 1   ketorolac (ACULAR) 0.5 % ophthalmic solution Instill 1 drop in right eye four times a day 5 mL 1   Lancet Devices (B-D LANCET DEVICE) MISC Inject 1 application into the skin 4 (four) times daily. 100 each 3   Lancets (FREESTYLE) lancets USED TO CHECK BLOOD GLUCOSE LEVELS 2 TIMES A DAY. 100 each 5  losartan (COZAAR) 25 MG tablet Take 0.5 tablets (12.5 mg total) by mouth daily. 90 tablet 1   metFORMIN (GLUCOPHAGE-XR) 500 MG 24 hr tablet Take 2 tablets (1,000 mg total) by mouth 2 (two) times daily. 180 tablet 1   methotrexate (RHEUMATREX) 2.5 MG tablet Take 8 tablets (20 mg total) by mouth every 7 (seven) days All on the same day 32 tablet 2   prednisoLONE acetate (PRED FORTE) 1 % ophthalmic suspension Apply 1 drop into left eye four times a day as directed. Shake well before each use! 5 mL 1   Semaglutide,0.25 or 0.5MG/DOS, (OZEMPIC, 0.25 OR 0.5 MG/DOSE,) 2 MG/1.5ML SOPN Inject 0.25 mg into the skin once a week. After 4 weeks, increase to 0.49m [place] qwk. 3 mL 3   No current facility-administered medications on file prior to visit.    Social History   Tobacco Use   Smoking status: Former    Packs/day: 1.00    Years: 15.00    Pack years: 15.00    Types: Cigarettes    Quit date: 09/08/2008    Years since quitting: 12.8    Smokeless tobacco: Never  Vaping Use   Vaping Use: Never used  Substance Use Topics   Alcohol use: No   Drug use: No    Review of Systems  Constitutional:  Negative for chills, fever and unexpected weight change.  HENT:  Positive for congestion and ear pain. Negative for ear discharge, facial swelling and hearing loss.   Respiratory:  Positive for cough.   Cardiovascular:  Negative for chest pain, palpitations and leg swelling.  Gastrointestinal:  Negative for nausea and vomiting.  Musculoskeletal:  Negative for arthralgias and myalgias.  Skin:  Negative for rash.  Neurological:  Negative for headaches.  Hematological:  Negative for adenopathy.  Psychiatric/Behavioral:  Negative for confusion.      Objective:    BP 98/60    Pulse 85    Ht 5' (1.524 m)    Wt 116 lb (52.6 kg)    SpO2 98%    BMI 22.65 kg/m   BP Readings from Last 3 Encounters:  07/11/21 98/60  05/09/21 130/60  02/07/21 124/72   Wt Readings from Last 3 Encounters:  07/11/21 116 lb (52.6 kg)  05/09/21 118 lb 9.6 oz (53.8 kg)  02/07/21 122 lb 12.8 oz (55.7 kg)    Physical Exam Vitals reviewed.  Constitutional:      Appearance: Normal appearance. She is well-developed.  HENT:     Head: Normocephalic and atraumatic.     Right Ear: Hearing, tympanic membrane, ear canal and external ear normal. No decreased hearing noted. No drainage, swelling or tenderness. No middle ear effusion. No foreign body. Tympanic membrane is not erythematous or bulging.     Left Ear: Hearing, tympanic membrane, ear canal and external ear normal. No decreased hearing noted. No drainage, swelling or tenderness.  No middle ear effusion. No foreign body. Tympanic membrane is not erythematous or bulging.     Nose: Nose normal. No rhinorrhea.     Right Sinus: No maxillary sinus tenderness or frontal sinus tenderness.     Left Sinus: No maxillary sinus tenderness or frontal sinus tenderness.     Mouth/Throat:     Pharynx: Uvula midline. No  oropharyngeal exudate or posterior oropharyngeal erythema.     Tonsils: No tonsillar abscesses.  Eyes:     Conjunctiva/sclera: Conjunctivae normal.  Neck:     Thyroid: No thyroid mass or thyromegaly.  Cardiovascular:  Rate and Rhythm: Normal rate and regular rhythm.     Pulses: Normal pulses.     Heart sounds: Normal heart sounds.  Pulmonary:     Effort: Pulmonary effort is normal.     Breath sounds: Normal breath sounds. No wheezing, rhonchi or rales.  Chest:  Breasts:    Breasts are symmetrical.     Right: No inverted nipple, mass, nipple discharge, skin change or tenderness.     Left: No inverted nipple, mass, nipple discharge, skin change or tenderness.  Abdominal:     General: Bowel sounds are normal. There is no distension.     Palpations: Abdomen is soft. Abdomen is not rigid. There is no fluid wave or mass.     Tenderness: There is no abdominal tenderness. There is no guarding or rebound.  Genitourinary:    Cervix: No cervical motion tenderness, discharge or friability.     Uterus: Not enlarged, not fixed and not tender.      Adnexa:        Right: No mass, tenderness or fullness.         Left: No mass, tenderness or fullness.       Comments: Pap performed. No CMT. Unable to appreciated ovaries. Lymphadenopathy:     Head:     Right side of head: No submental, submandibular, tonsillar, preauricular, posterior auricular or occipital adenopathy.     Left side of head: No submental, submandibular, tonsillar, preauricular, posterior auricular or occipital adenopathy.     Cervical: No cervical adenopathy.     Right cervical: No superficial, deep or posterior cervical adenopathy.    Left cervical: No superficial, deep or posterior cervical adenopathy.     Upper Body:     Right upper body: No pectoral adenopathy.     Left upper body: No pectoral adenopathy.  Skin:    General: Skin is warm and dry.  Neurological:     Mental Status: She is alert.  Psychiatric:         Speech: Speech normal.        Behavior: Behavior normal.        Thought Content: Thought content normal.       Assessment & Plan:   Problem List Items Addressed This Visit       Endocrine   DM (diabetes mellitus), type 2 with renal complications ([place])    Lab Results  Component Value Date   HGBA1C 7.7 (A) 05/09/2021  Improved. Continue Ozempic 0.5 mg, metformin 2000 mg        Other   B12 deficiency    Fatigue as improved. Pending b12 today to see if responded. She prefers to have b12 done with RN at work which I have prescribed. We will continue b12 IM monthly for now.       Relevant Medications   cyanocobalamin (,VITAMIN B-12,) 1000 MCG/ML injection   cyanocobalamin ((VITAMIN B-12)) injection 1,000 mcg (Start on 07/11/2021  5:00 PM)   Other Relevant Orders   B12 and Folate Panel   Left ear pain    Overall benign HEENT exam. Advised trial of mucinex. She is concerned for bacterial infection and I have prescribed augmentin to start in the next 1-2 days if symptom persist. Advised probiotics. Declines ent referral due to h/o left ear tumor.  She will let me know how she is doing      Relevant Medications   amoxicillin-clavulanate (AUGMENTIN) 875-125 MG tablet   Routine physical examination - Primary    Clinical  breast exam and pap performed today.  Colonoscopy referral and CT for lung cancer screening has been placed.  Discussed sleep hygiene and encouraged starting melatonin.        Relevant Orders   Ambulatory Referral for Lung Cancer Scre   Ambulatory referral to Gastroenterology   Cytology - PAP( [place])     I have discontinued Caren Griffins H. Case's moxifloxacin and tobramycin. I am also having her start on cyanocobalamin and amoxicillin-clavulanate. Additionally, I am having her maintain her B-D LANCET DEVICE, FREESTYLE LITE, FreeStyle [place] Lite, freestyle, losartan, Ozempic (0.25 or 0.5 MG/DOSE), metFORMIN, folic acid, ketorolac, prednisoLONE acetate, methotrexate,  ketorolac, and gabapentin. We will continue to administer cyanocobalamin.   Meds ordered this encounter  Medications   cyanocobalamin (,VITAMIN B-12,) 1000 MCG/ML injection    Sig: 1000 mcg (1 mL) intramuscular injection in the thigh ( vastus lateralis) once per month.    Dispense:  3 mL    Refill:  3    Order Specific Question:   Supervising Provider    Answer:   Deborra Medina L [[id number]]   amoxicillin-clavulanate (AUGMENTIN) 875-125 MG tablet    Sig: Take 1 tablet by mouth 2 (two) times daily for 7 days.    Dispense:  14 tablet    Refill:  0    Order Specific Question:   Supervising Provider    Answer:   Deborra Medina L [[id number]]   cyanocobalamin ((VITAMIN B-12)) injection 1,000 mcg    Return precautions given.   Risks, benefits, and alternatives of the medications and treatment plan prescribed today were discussed, and patient expressed understanding.   Education regarding symptom management and diagnosis given to patient on AVS.   Continue to follow with Burnard Hawthorne, FNP for routine health maintenance.   Romie Levee Chriscoe and I agreed with plan.   Mable Paris, FNP

## 2021-07-11 NOTE — Assessment & Plan Note (Addendum)
Clinical breast exam and pap performed today.  Colonoscopy referral and CT for lung cancer screening has been placed.  Discussed sleep hygiene and encouraged starting melatonin.

## 2021-07-11 NOTE — Patient Instructions (Addendum)
I have sent in Augmentin for left ear pain and perhaps early bacterial infection .  Please let me know if doesn't resolve and f you would like referral to ENT referral for colonoscopy, and also to pulmonology for lung cancer screening program   Let us know if you dont hear back within a week in regards to an appointment being scheduled.   Ensure to take probiotics while on antibiotics and also for 2 weeks after completion. This can either be by eating yogurt daily or taking a probiotic supplement over the counter such as Culturelle.It is important to re-colonize the gut with good bacteria and also to prevent any diarrheal infections associated with antibiotic use.    essentials for good sleep:   #1 Exercise #2 Limit Caffeine ( no caffeine after lunch) #3 No smart phones, TV prior to bed -- BLUE light is VERY activating and send the brain an 'awake message.'  #4 Go to bed at same time of night each night and get up at same time of day.  #5 Take 0.5 to 5mg  melatonin at 7pm with dinner -this is when natural melatonin will start to increase #6 May try over the counter Unisom for sleep aid  Health Maintenance for Postmenopausal Women Menopause is a normal process in which your ability to get pregnant comes to an end. This process happens slowly over many months or years, usually between the ages of 67 and 76. Menopause is complete when you have missed your menstrual period for 12 months. It is important to talk with your health care provider about some of the most common conditions that affect women after menopause (postmenopausal women). These include heart disease, cancer, and bone loss (osteoporosis). Adopting a healthy lifestyle and getting preventive care can help to promote your health and wellness. The actions you take can also lower your chances of developing some of these common conditions. What are the signs and symptoms of menopause? During menopause, you may have the following  symptoms: Hot flashes. These can be moderate or severe. Night sweats. Decrease in sex drive. Mood swings. Headaches. Tiredness (fatigue). Irritability. Memory problems. Problems falling asleep or staying asleep. Talk with your health care provider about treatment options for your symptoms. Do I need hormone replacement therapy? Hormone replacement therapy is effective in treating symptoms that are caused by menopause, such as hot flashes and night sweats. Hormone replacement carries certain risks, especially as you become older. If you are thinking about using estrogen or estrogen with progestin, discuss the benefits and risks with your health care provider. How can I reduce my risk for heart disease and stroke? The risk of heart disease, heart attack, and stroke increases as you age. One of the causes may be a change in the body's hormones during menopause. This can affect how your body uses dietary fats, triglycerides, and cholesterol. Heart attack and stroke are medical emergencies. There are many things that you can do to help prevent heart disease and stroke. Watch your blood pressure High blood pressure causes heart disease and increases the risk of stroke. This is more likely to develop in people who have high blood pressure readings or are overweight. Have your blood pressure checked: Every 3-5 years if you are 13-92 years of age. Every year if you are 61 years old or older. Eat a healthy diet  Eat a diet that includes plenty of vegetables, fruits, low-fat dairy products, and lean protein. Do not eat a lot of foods that are high in  solid fats, added sugars, or sodium. Get regular exercise Get regular exercise. This is one of the most important things you can do for your health. Most adults should: Try to exercise for at least 150 minutes each week. The exercise should increase your heart rate and make you sweat (moderate-intensity exercise). Try to do strengthening exercises at  least twice each week. Do these in addition to the moderate-intensity exercise. Spend less time sitting. Even light physical activity can be beneficial. Other tips Work with your health care provider to achieve or maintain a healthy weight. Do not use any products that contain nicotine or tobacco. These products include cigarettes, chewing tobacco, and vaping devices, such as e-cigarettes. If you need help quitting, ask your health care provider. Know your numbers. Ask your health care provider to check your cholesterol and your blood sugar (glucose). Continue to have your blood tested as directed by your health care provider. Do I need screening for cancer? Depending on your health history and family history, you may need to have cancer screenings at different stages of your life. This may include screening for: Breast cancer. Cervical cancer. Lung cancer. Colorectal cancer. What is my risk for osteoporosis? After menopause, you may be at increased risk for osteoporosis. Osteoporosis is a condition in which bone destruction happens more quickly than new bone creation. To help prevent osteoporosis or the bone fractures that can happen because of osteoporosis, you may take the following actions: If you are 71-40 years old, get at least 1,000 mg of calcium and at least 600 international units (IU) of vitamin D per day. If you are older than age 11 but younger than age 67, get at least 1,200 mg of calcium and at least 600 international units (IU) of vitamin D per day. If you are older than age 32, get at least 1,200 mg of calcium and at least 800 international units (IU) of vitamin D per day. Smoking and drinking excessive alcohol increase the risk of osteoporosis. Eat foods that are rich in calcium and vitamin D, and do weight-bearing exercises several times each week as directed by your health care provider. How does menopause affect my mental health? Depression may occur at any age, but it is more  common as you become older. Common symptoms of depression include: Feeling depressed. Changes in sleep patterns. Changes in appetite or eating patterns. Feeling an overall lack of motivation or enjoyment of activities that you previously enjoyed. Frequent crying spells. Talk with your health care provider if you think that you are experiencing any of these symptoms. General instructions See your health care provider for regular wellness exams and vaccines. This may include: Scheduling regular health, dental, and eye exams. Getting and maintaining your vaccines. These include: Influenza vaccine. Get this vaccine each year before the flu season begins. Pneumonia vaccine. Shingles vaccine. Tetanus, diphtheria, and pertussis (Tdap) booster vaccine. Your health care provider may also recommend other immunizations. Tell your health care provider if you have ever been abused or do not feel safe at home. Summary Menopause is a normal process in which your ability to get pregnant comes to an end. This condition causes hot flashes, night sweats, decreased interest in sex, mood swings, headaches, or lack of sleep. Treatment for this condition may include hormone replacement therapy. Take actions to keep yourself healthy, including exercising regularly, eating a healthy diet, watching your weight, and checking your blood pressure and blood sugar levels. Get screened for cancer and depression. Make sure that you  are up to date with all your vaccines. This information is not intended to replace advice given to you by your health care provider. Make sure you discuss any questions you have with your health care provider. Document Revised: 10/07/2020 Document Reviewed: 10/07/2020 Elsevier Patient Education  [place].

## 2021-07-11 NOTE — Assessment & Plan Note (Signed)
Lab Results  Component Value Date   HGBA1C 7.7 (A) 05/09/2021   Improved. Continue Ozempic 0.5 mg, metformin 2000 mg

## 2021-07-11 NOTE — Assessment & Plan Note (Addendum)
Overall benign HEENT exam. Advised trial of mucinex. She is concerned for bacterial infection and I have prescribed augmentin to start in the next 1-2 days if symptom persist. Advised probiotics. Declines ent referral due to h/o left ear tumor.  She will let me know how she is doing

## 2021-07-11 NOTE — Assessment & Plan Note (Signed)
Fatigue as improved. Pending b12 today to see if responded. She prefers to have b12 done with RN at work which I have prescribed. We will continue b12 IM monthly for now.

## 2021-07-12 LAB — B12 AND FOLATE PANEL
Folate: >24 ng/mL
Vitamin B-12: 370 pg/mL (ref 200–1100)

## 2021-07-14 ENCOUNTER — Other Ambulatory Visit: Payer: Self-pay

## 2021-07-14 DIAGNOSIS — Z1211 Encounter for screening for malignant neoplasm of colon: Secondary | ICD-10-CM

## 2021-07-14 MED ORDER — CLENPIQ 10-3.5-12 MG-GM -GM/160ML PO SOLN
1.0000 | Freq: Once | ORAL | 0 refills | Status: AC
  Filled 2021-07-14: qty 320, 1d supply, fill #0

## 2021-07-14 NOTE — Progress Notes (Signed)
Gastroenterology Pre-Procedure Review  Request Date: 07/29/2021 Requesting Physician: Dr. Marius Ditch  PATIENT REVIEW QUESTIONS: The patient responded to the following health history questions as indicated:    1. Are you having any GI issues? no 2. Do you have a personal history of Polyps? no 3. Do you have a family history of Colon Cancer or Polyps? no 4. Diabetes Mellitus? yes (Type II) 5. Joint replacements in the past 12 months?no 6. Major health problems in the past 3 months?yes, diabetic retinopathy 7. Any artificial heart valves, MVP, or defibrillator?no    MEDICATIONS & ALLERGIES:    Patient reports the following regarding taking any anticoagulation/antiplatelet therapy:   Plavix, Coumadin, Eliquis, Xarelto, Lovenox, Pradaxa, Brilinta, or Effient? no Aspirin? no  Patient confirms/reports the following medications:  Current Outpatient Medications  Medication Sig Dispense Refill   amoxicillin-clavulanate (AUGMENTIN) 875-125 MG tablet Take 1 tablet by mouth 2 (two) times daily for 7 days. 14 tablet 0   Blood Glucose Monitoring Suppl (FREESTYLE FREEDOM LITE) w/Device KIT 1 each by Does not apply route daily. 1 kit 0   cyanocobalamin (,VITAMIN B-12,) 1000 MCG/ML injection 1000 mcg (1 mL) intramuscular injection in the thigh ( vastus lateralis) once per month. 3 mL 3   folic acid (FOLVITE) 1 MG tablet Take 1 tablet (1 mg total) by mouth once daily 90 tablet 3   gabapentin (NEURONTIN) 100 MG capsule Take 1 capsule (100 mg total) by mouth every evening. 90 capsule 3   glucose blood (FREESTYLE LITE) test strip USED TO CHECK BLOOD GLUCOSE 2 TIMES A DAY. 100 each 5   ketorolac (ACULAR) 0.5 % ophthalmic solution Apply one drop into the left eye 4 times a day as directed 5 mL 1   ketorolac (ACULAR) 0.5 % ophthalmic solution Instill 1 drop in right eye four times a day 5 mL 1   Lancet Devices (B-D LANCET DEVICE) MISC Inject 1 application into the skin 4 (four) times daily. 100 each 3   Lancets  (FREESTYLE) lancets USED TO CHECK BLOOD GLUCOSE LEVELS 2 TIMES A DAY. 100 each 5   losartan (COZAAR) 25 MG tablet Take 0.5 tablets (12.5 mg total) by mouth daily. 90 tablet 1   metFORMIN (GLUCOPHAGE-XR) 500 MG 24 hr tablet Take 2 tablets (1,000 mg total) by mouth 2 (two) times daily. 180 tablet 1   methotrexate (RHEUMATREX) 2.5 MG tablet Take 8 tablets (20 mg total) by mouth every 7 (seven) days All on the same day 32 tablet 2   NEEDLE, DISP, 25 G (BD ECLIPSE NEEDLE) 25G X 1" MISC Used to give monthly B-12 injections. 50 each 1   prednisoLONE acetate (PRED FORTE) 1 % ophthalmic suspension Apply 1 drop into left eye four times a day as directed. Shake well before each use! 5 mL 1   Semaglutide,0.25 or 0.5MG/DOS, (OZEMPIC, 0.25 OR 0.5 MG/DOSE,) 2 MG/1.5ML SOPN Inject 0.25 mg into the skin once a week. After 4 weeks, increase to 0.38m [place] qwk. 3 mL 3   SYRINGE/NEEDLE, DISP, 1 ML (BD LUER-LOK SYRINGE) 25G X 5/8" 1 ML MISC Used to give monthly B-12 injections. 50 each 1   No current facility-administered medications for this visit.    Patient confirms/reports the following allergies:  No Known Allergies  No orders of the defined types were placed in this encounter.   AUTHORIZATION INFORMATION Primary Insurance: 1D#: Group #:  Secondary Insurance: 1D#: Group #:  SCHEDULE INFORMATION: Date: 07/29/2021 Time: Location: [facility]

## 2021-07-15 LAB — CYTOLOGY - PAP
Comment: NEGATIVE
Diagnosis: NEGATIVE
High risk HPV: NEGATIVE

## 2021-07-17 ENCOUNTER — Encounter: Payer: Self-pay | Admitting: Gastroenterology

## 2021-07-29 ENCOUNTER — Ambulatory Visit: Payer: 59 | Admitting: Anesthesiology

## 2021-07-29 ENCOUNTER — Ambulatory Visit
Admission: RE | Admit: 2021-07-29 | Discharge: 2021-07-29 | Disposition: A | Discharge status: Home / Self Care | Payer: 59 | Attending: Gastroenterology | Admitting: Gastroenterology

## 2021-07-29 ENCOUNTER — Encounter
Admission: RE | Disposition: A | Discharge status: Home / Self Care | Payer: Self-pay | Source: Home / Self Care | Attending: Gastroenterology

## 2021-07-29 ENCOUNTER — Other Ambulatory Visit: Payer: Self-pay

## 2021-07-29 ENCOUNTER — Encounter: Payer: Self-pay | Admitting: Gastroenterology

## 2021-07-29 DIAGNOSIS — Z1211 Encounter for screening for malignant neoplasm of colon: Secondary | ICD-10-CM | POA: Diagnosis not present

## 2021-07-29 DIAGNOSIS — K635 Polyp of colon: Secondary | ICD-10-CM

## 2021-07-29 DIAGNOSIS — D125 Benign neoplasm of sigmoid colon: Secondary | ICD-10-CM | POA: Insufficient documentation

## 2021-07-29 DIAGNOSIS — D126 Benign neoplasm of colon, unspecified: Secondary | ICD-10-CM | POA: Diagnosis not present

## 2021-07-29 DIAGNOSIS — E119 Type 2 diabetes mellitus without complications: Secondary | ICD-10-CM | POA: Diagnosis not present

## 2021-07-29 DIAGNOSIS — K621 Rectal polyp: Secondary | ICD-10-CM | POA: Insufficient documentation

## 2021-07-29 DIAGNOSIS — Z7984 Long term (current) use of oral hypoglycemic drugs: Secondary | ICD-10-CM | POA: Insufficient documentation

## 2021-07-29 DIAGNOSIS — Z7985 Long-term (current) use of injectable non-insulin antidiabetic drugs: Secondary | ICD-10-CM | POA: Diagnosis not present

## 2021-07-29 DIAGNOSIS — Z87891 Personal history of nicotine dependence: Secondary | ICD-10-CM | POA: Insufficient documentation

## 2021-07-29 DIAGNOSIS — E1129 Type 2 diabetes mellitus with other diabetic kidney complication: Secondary | ICD-10-CM

## 2021-07-29 HISTORY — PX: POLYPECTOMY: SHX5525

## 2021-07-29 HISTORY — PX: COLONOSCOPY WITH PROPOFOL: SHX5780

## 2021-07-29 HISTORY — DX: Presence of dental prosthetic device (complete) (partial): Z97.2

## 2021-07-29 LAB — GLUCOSE, CAPILLARY
Glucose-Capillary: 121 mg/dL — ABNORMAL HIGH (ref 70–99)
Glucose-Capillary: 135 mg/dL — ABNORMAL HIGH (ref 70–99)

## 2021-07-29 SURGERY — COLONOSCOPY WITH PROPOFOL
Anesthesia: General | Site: Rectum

## 2021-07-29 MED ORDER — LACTATED RINGERS IV SOLN: INTRAVENOUS | Status: DC

## 2021-07-29 MED ORDER — LIDOCAINE HCL (CARDIAC) PF 100 MG/5ML IV SOSY
PREFILLED_SYRINGE | INTRAVENOUS | Status: DC | PRN
Start: 2021-07-29 — End: 2021-07-29
  Administered 2021-07-29: 30 mg via INTRAVENOUS

## 2021-07-29 MED ORDER — SODIUM CHLORIDE 0.9 % IV SOLN: INTRAVENOUS | Status: DC

## 2021-07-29 MED ORDER — STERILE WATER FOR IRRIGATION IR SOLN
Status: DC | PRN
  Administered 2021-07-29: 120 mL

## 2021-07-29 MED ORDER — PROPOFOL 10 MG/ML IV BOLUS
INTRAVENOUS | Status: DC | PRN
  Administered 2021-07-29: 100 mg via INTRAVENOUS
  Administered 2021-07-29: 40 mg via INTRAVENOUS
  Administered 2021-07-29: 30 mg via INTRAVENOUS
  Administered 2021-07-29 (×3): 40 mg via INTRAVENOUS
  Administered 2021-07-29: 30 mg via INTRAVENOUS
  Administered 2021-07-29: 40 mg via INTRAVENOUS

## 2021-07-29 MED ORDER — ACETAMINOPHEN 325 MG PO TABS: 325.0000 mg | ORAL_TABLET | ORAL | Status: DC | PRN

## 2021-07-29 MED ORDER — STERILE WATER FOR IRRIGATION IR SOLN
Status: DC | PRN
Start: 2021-07-29 — End: 2021-07-29
  Administered 2021-07-29: 200 mL

## 2021-07-29 MED ORDER — ACETAMINOPHEN 160 MG/5ML PO SOLN: 325.0000 mg | ORAL | Status: DC | PRN

## 2021-07-29 SURGICAL SUPPLY — 8 items
GOWN CVR UNV OPN BCK APRN NK (MISCELLANEOUS) ×2 IMPLANT
GOWN ISOL THUMB LOOP REG UNIV (MISCELLANEOUS) ×4
KIT PRC NS LF DISP ENDO (KITS) ×1 IMPLANT
KIT PROCEDURE OLYMPUS (KITS) ×2
MANIFOLD NEPTUNE II (INSTRUMENTS) ×2 IMPLANT
SNARE COLD EXACTO (MISCELLANEOUS) ×1 IMPLANT
TRAP ETRAP POLY (MISCELLANEOUS) ×1 IMPLANT
WATER STERILE IRR 250ML POUR (IV SOLUTION) ×2 IMPLANT

## 2021-07-29 NOTE — Transfer of Care (Signed)
Immediate Anesthesia Transfer of Care Note  Patient: Rachael Weeks  Procedure(s) Performed: COLONOSCOPY WITH PROPOFOL (Rectum) POLYPECTOMY (Rectum)  Patient Location: PACU  Anesthesia Type: General  Level of Consciousness: awake, alert  and patient cooperative  Airway and Oxygen Therapy: Patient Spontanous Breathing and Patient connected to supplemental oxygen  Post-op Assessment: Post-op Vital signs reviewed, Patient's Cardiovascular Status Stable, Respiratory Function Stable, Patent Airway and No signs of Nausea or vomiting  Post-op Vital Signs: Reviewed and stable  Complications: No notable events documented.

## 2021-07-29 NOTE — Op Note (Addendum)
[facility] Gastroenterology Patient Name: Rachael Weeks Procedure Date: 07/29/2021 7:16 AM MRN: [id number] Account #: [id number] Date of Birth: 05/13/1961 Admit Type: Outpatient Age: 61 Room: [facility] OR ROOM 01 Gender: Female Note Status: Finalized Instrument Name: [id number] Procedure:             Colonoscopy Indications:           Screening for colorectal malignant neoplasm, Last                         colonoscopy: June 2014, Last colonoscopy 10 years ago Providers:             Lin Landsman MD, MD Referring MD:          Yvetta Coder. Arnett (Referring MD) Medicines:             General Anesthesia Complications:         No immediate complications. Estimated blood loss: None. Procedure:             Pre-Anesthesia Assessment:                        - Prior to the procedure, a History and Physical was                         performed, and patient medications and allergies were                         reviewed. The patient is competent. The risks and                         benefits of the procedure and the sedation options and                         risks were discussed with the patient. All questions                         were answered and informed consent was obtained.                         Patient identification and proposed procedure were                         verified by the physician, the nurse, the                         anesthesiologist, the anesthetist and the technician                         in the pre-procedure area in the procedure room in the                         endoscopy suite. Mental Status Examination: alert and                         oriented. Airway Examination: normal oropharyngeal                         airway and neck mobility. Respiratory Examination:  clear to auscultation. CV Examination: normal.                         Prophylactic Antibiotics: The patient does not require                          prophylactic antibiotics. Prior Anticoagulants: The                         patient has taken no previous anticoagulant or                         antiplatelet agents. ASA Grade Assessment: II - A                         patient with mild systemic disease. After reviewing                         the risks and benefits, the patient was deemed in                         satisfactory condition to undergo the procedure. The                         anesthesia plan was to use general anesthesia.                         Immediately prior to administration of medications,                         the patient was re-assessed for adequacy to receive                         sedatives. The heart rate, respiratory rate, oxygen                         saturations, blood pressure, adequacy of pulmonary                         ventilation, and response to care were monitored                         throughout the procedure. The physical status of the                         patient was re-assessed after the procedure.                        After obtaining informed consent, the colonoscope was                         passed under direct vision. Throughout the procedure,                         the patient's blood pressure, pulse, and oxygen                         saturations were monitored continuously. The  Colonoscope was introduced through the anus and                         advanced to the the terminal ileum, with                         identification of the appendiceal orifice and IC                         valve. The colonoscopy was performed without                         difficulty. The patient tolerated the procedure well.                         The quality of the bowel preparation was evaluated                         using the BBPS [facility] Bowel Preparation Scale) with                         scores of: Right Colon = 3, Transverse Colon = 3 and                          Left Colon = 3 (entire mucosa seen well with no                         residual staining, small fragments of stool or opaque                         liquid). The total BBPS score equals 9. Findings:      The perianal and digital rectal examinations were normal. Pertinent       negatives include normal sphincter tone and no palpable rectal lesions.      The terminal ileum appeared normal.      Two sessile polyps were found in the rectum and sigmoid colon. The       polyps were 3 to 4 mm in size. These polyps were removed with a cold       snare. Resection and retrieval were complete. Estimated blood loss: none.      The retroflexed view of the distal rectum and anal verge was normal and       showed no anal or rectal abnormalities. Impression:            - The examined portion of the ileum was normal.                        - Two 3 to 4 mm polyps in the rectum and in the                         sigmoid colon, removed with a cold snare. Resected and                         retrieved.                        - The distal rectum and anal verge are normal on  retroflexion view. Recommendation:        - Discharge patient to home (with escort).                        - Resume previous diet today.                        - Continue present medications.                        - Await pathology results.                        - Repeat colonoscopy in 7-10 years for surveillance                         based on pathology results. Procedure Code(s):     --- Professional ---                        [phone], Colonoscopy, flexible; with removal of                         tumor(s), polyp(s), or other lesion(s) by snare                         technique Diagnosis Code(s):     --- Professional ---                        Z12.11, Encounter for screening for malignant neoplasm                         of colon                        K63.5, Polyp of colon                        K62.1,  Rectal polyp CPT copyright 2019 American Medical Association. All rights reserved. The codes documented in this report are preliminary and upon coder review may  be revised to meet current compliance requirements. Dr. Ulyess Mort Lin Landsman MD, MD 07/29/2021 8:28:00 AM This report has been signed electronically. Number of Addenda: 0 Note Initiated On: 07/29/2021 7:16 AM Scope Withdrawal Time: 0 hours 15 minutes 50 seconds  Total Procedure Duration: 0 hours 22 minutes 30 seconds  Estimated Blood Loss:  Estimated blood loss: none.      [facility]

## 2021-07-29 NOTE — Anesthesia Preprocedure Evaluation (Signed)
Anesthesia Evaluation  Patient identified by MRN, date of birth, ID band Patient awake    Reviewed: Allergy & Precautions, H&P , NPO status , Patient's Chart, lab work & pertinent test results, reviewed documented beta blocker date and time   Airway Mallampati: II  TM Distance: >3 FB Neck ROM: full    Dental  (+) Edentulous Upper, Edentulous Lower   Pulmonary neg pulmonary ROS, former smoker,    Pulmonary exam normal breath sounds clear to auscultation       Cardiovascular Exercise Tolerance: Good negative cardio ROS Normal cardiovascular exam Rhythm:regular Rate:Normal     Neuro/Psych negative neurological ROS  negative psych ROS   GI/Hepatic negative GI ROS, Neg liver ROS,   Endo/Other  diabetes, Type 2  Renal/GU negative Renal ROS  negative genitourinary   Musculoskeletal   Abdominal   Peds  Hematology negative hematology ROS (+)   Anesthesia Other Findings   Reproductive/Obstetrics negative OB ROS                             Anesthesia Physical Anesthesia Plan  ASA: 2  Anesthesia Plan: General   Post-op Pain Management:    Induction:   PONV Risk Score and Plan:   Airway Management Planned:   Additional Equipment:   Intra-op Plan:   Post-operative Plan:   Informed Consent: I have reviewed the patients History and Physical, chart, labs and discussed the procedure including the risks, benefits and alternatives for the proposed anesthesia with the patient or authorized representative who has indicated his/her understanding and acceptance.     Dental Advisory Given  Plan Discussed with: CRNA and Anesthesiologist  Anesthesia Plan Comments:         Anesthesia Quick Evaluation

## 2021-07-29 NOTE — Anesthesia Postprocedure Evaluation (Signed)
Anesthesia Post Note  Patient: Rachael Weeks  Procedure(s) Performed: COLONOSCOPY WITH PROPOFOL (Rectum) POLYPECTOMY (Rectum)     Patient location during evaluation: PACU Anesthesia Type: General Level of consciousness: awake and alert Pain management: pain level controlled Vital Signs Assessment: post-procedure vital signs reviewed and stable Respiratory status: spontaneous breathing, nonlabored ventilation, respiratory function stable and patient connected to nasal cannula oxygen Cardiovascular status: blood pressure returned to baseline and stable Postop Assessment: no apparent nausea or vomiting Anesthetic complications: no   No notable events documented.  Trecia Rogers

## 2021-07-29 NOTE — H&P (Signed)
Rachael Darby, MD [address]  [place]  [place], [place] [address]  Main: [phone]  Fax: [phone] Pager: [phone]  Primary Care Physician:  Burnard Hawthorne, FNP Primary Gastroenterologist:  Dr. Cephas Weeks  Pre-Procedure History & Physical: HPI:  Rachael Weeks is a 61 y.o. female is here for an colonoscopy.   Past Medical History:  Diagnosis Date   Chicken pox    Diabetes mellitus without complication ([place])    Kidney stones    Wears dentures     Past Surgical History:  Procedure Laterality Date   BREAST BIOPSY Right 1983   benign   BREAST SURGERY     CATARACT EXTRACTION, BILATERAL     INNER EAR SURGERY      Prior to Admission medications   Medication Sig Start Date End Date Taking? Authorizing Provider  cyanocobalamin (,VITAMIN B-12,) 1000 MCG/ML injection 1000 mcg (1 mL) intramuscular injection in the thigh ( vastus lateralis) once per month. 07/11/21  Yes Burnard Hawthorne, FNP  folic acid (FOLVITE) 1 MG tablet Take 1 tablet (1 mg total) by mouth once daily 03/10/21  Yes   gabapentin (NEURONTIN) 100 MG capsule Take 1 capsule (100 mg total) by mouth every evening. 05/09/21  Yes Burnard Hawthorne, FNP  losartan (COZAAR) 25 MG tablet Take 0.5 tablets (12.5 mg total) by mouth daily. 01/15/21  Yes Burnard Hawthorne, FNP  metFORMIN (GLUCOPHAGE-XR) 500 MG 24 hr tablet Take 2 tablets (1,000 mg total) by mouth 2 (two) times daily. 02/25/21  Yes Burnard Hawthorne, FNP  methotrexate (RHEUMATREX) 2.5 MG tablet Take 8 tablets (20 mg total) by mouth every 7 (seven) days All on the same day 04/08/21  Yes   NEEDLE, DISP, 25 G (BD ECLIPSE NEEDLE) 25G X 1" MISC Used to give monthly B-12 injections. 07/11/21  Yes Arnett, Yvetta Coder, FNP  Semaglutide,0.25 or 0.5MG/DOS, (OZEMPIC, 0.25 OR 0.5 MG/DOSE,) 2 MG/1.5ML SOPN Inject 0.25 mg into the skin once a week. After 4 weeks, increase to 0.29m [place] qwk. 02/07/21  Yes Arnett, MYvetta Coder FNP  Blood Glucose Monitoring Suppl  (FREESTYLE FREEDOM LITE) w/Device KIT 1 each by Does not apply route daily. 01/07/21   ABurnard Hawthorne FNP  glucose blood (FREESTYLE LITE) test strip USED TO CHECK BLOOD GLUCOSE 2 TIMES A DAY. 01/07/21   ABurnard Hawthorne FNP  ketorolac (ACULAR) 0.5 % ophthalmic solution Apply one drop into the left eye 4 times a day as directed 03/18/21   BDarleen Crocker MD  ketorolac (ACULAR) 0.5 % ophthalmic solution Instill 1 drop in right eye four times a day 04/18/21     Lancet Devices (B-D LANCET DEVICE) MISC Inject 1 application into the skin 4 (four) times daily. 09/24/16   ABurnard Hawthorne FNP  Lancets (FREESTYLE) lancets USED TO CHECK BLOOD GLUCOSE LEVELS 2 TIMES A DAY. 01/07/21   ABurnard Hawthorne FNP  prednisoLONE acetate (PRED FORTE) 1 % ophthalmic suspension Apply 1 drop into left eye four times a day as directed. Shake well before each use! 03/18/21     SYRINGE/NEEDLE, DISP, 1 ML (BD LUER-LOK SYRINGE) 25G X 5/8" 1 ML MISC Used to give monthly B-12 injections. 07/11/21   ABurnard Hawthorne FNP    Allergies as of 07/14/2021   (No Known Allergies)    Family History  Problem Relation Age of Onset   Hypertension Mother    Cancer Father        Prostate   Hyperlipidemia Father  Stroke Father    Hypertension Father    Diabetes Father    Diabetes Sister    Diabetes Sister    Hyperlipidemia Brother    Stroke Brother    Hypertension Brother    Diabetes Brother    Diabetes Brother    Diabetes Brother    Diabetes Paternal Grandfather    Diabetes Son    Breast cancer Paternal Aunt 28   Colon cancer Neg Hx    Thyroid cancer Neg Hx     Social History   Socioeconomic History   Marital status: Widowed    Spouse name: Not on file   Number of children: Not on file   Years of education: Not on file   Highest education level: Not on file  Occupational History   Not on file  Tobacco Use   Smoking status: Former    Packs/day: 1.00    Years: 15.00    Pack years: 15.00    Types:  Cigarettes    Quit date: 09/08/2008    Years since quitting: 12.8   Smokeless tobacco: Never  Vaping Use   Vaping Use: Never used  Substance and Sexual Activity   Alcohol use: No   Drug use: No   Sexual activity: Yes    Partners: Male  Other Topics Concern   Not on file  Social History Narrative   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    Has been there 10 years.    Married   5 stepchildren   14 grandchildren   Social Determinants of Radio broadcast assistant Strain: Not on file  Food Insecurity: Not on file  Transportation Needs: Not on file  Physical Activity: Not on file  Stress: Not on file  Social Connections: Not on file  Intimate Partner Violence: Not on file    Review of Systems: See HPI, otherwise negative ROS  Physical Exam: BP (!) 111/58    Pulse 82    Temp (!) 97 F (36.1 C) (Temporal)    Resp 18    Ht 5' (1.524 m)    Wt 51.3 kg    SpO2 100%    BMI 22.09 kg/m  General:   Alert,  pleasant and cooperative in NAD Head:  Normocephalic and atraumatic. Neck:  Supple; no masses or thyromegaly. Lungs:  Clear throughout to auscultation.    Heart:  Regular rate and rhythm. Abdomen:  Soft, nontender and nondistended. Normal bowel sounds, without guarding, and without rebound.   Neurologic:  Alert and  oriented x4;  grossly normal neurologically.  Impression/Plan: [name] is here for an colonoscopy to be performed for colon cancer csreening  Risks, benefits, limitations, and alternatives regarding  colonoscopy have been reviewed with the patient.  Questions have been answered.  All parties agreeable.   Sherri Sear, MD  07/29/2021, 7:55 AM

## 2021-07-29 NOTE — Anesthesia Procedure Notes (Signed)
Date/Time: 07/29/2021 8:00 AM Performed by: Cameron Ali, CRNA Pre-anesthesia Checklist: Patient identified, Emergency Drugs available, Suction available, Timeout performed and Patient being monitored Patient Re-evaluated:Patient Re-evaluated prior to induction Oxygen Delivery Method: Nasal cannula Placement Confirmation: positive ETCO2

## 2021-07-31 ENCOUNTER — Encounter: Payer: Self-pay | Admitting: Gastroenterology

## 2021-07-31 LAB — SURGICAL PATHOLOGY

## 2021-09-02 DIAGNOSIS — E113511 Type 2 diabetes mellitus with proliferative diabetic retinopathy with macular edema, right eye: Secondary | ICD-10-CM | POA: Diagnosis not present

## 2021-09-02 DIAGNOSIS — E113412 Type 2 diabetes mellitus with severe nonproliferative diabetic retinopathy with macular edema, left eye: Secondary | ICD-10-CM | POA: Diagnosis not present

## 2021-09-02 DIAGNOSIS — H43822 Vitreomacular adhesion, left eye: Secondary | ICD-10-CM | POA: Diagnosis not present

## 2021-09-04 ENCOUNTER — Other Ambulatory Visit: Payer: Self-pay

## 2021-09-04 DIAGNOSIS — Z122 Encounter for screening for malignant neoplasm of respiratory organs: Secondary | ICD-10-CM

## 2021-09-04 DIAGNOSIS — Z87891 Personal history of nicotine dependence: Secondary | ICD-10-CM

## 2021-09-09 DIAGNOSIS — E113412 Type 2 diabetes mellitus with severe nonproliferative diabetic retinopathy with macular edema, left eye: Secondary | ICD-10-CM | POA: Diagnosis not present

## 2021-09-09 DIAGNOSIS — E113511 Type 2 diabetes mellitus with proliferative diabetic retinopathy with macular edema, right eye: Secondary | ICD-10-CM | POA: Diagnosis not present

## 2021-09-10 ENCOUNTER — Encounter: Payer: Self-pay | Admitting: Family

## 2021-09-10 ENCOUNTER — Ambulatory Visit: Payer: 59 | Admitting: Family

## 2021-09-10 VITALS — BP 122/64 | HR 64 | Temp 97.9°F | Ht 62.0 in | Wt 118.2 lb

## 2021-09-10 DIAGNOSIS — R809 Proteinuria, unspecified: Secondary | ICD-10-CM | POA: Diagnosis not present

## 2021-09-10 DIAGNOSIS — E1129 Type 2 diabetes mellitus with other diabetic kidney complication: Secondary | ICD-10-CM

## 2021-09-10 LAB — POCT GLYCOSYLATED HEMOGLOBIN (HGB A1C): Hemoglobin A1C: 7.5 % — AB (ref 4.0–5.6)

## 2021-09-10 NOTE — Assessment & Plan Note (Addendum)
Lab Results  ?Component Value Date  ? HGBA1C 7.5 (A) 09/10/2021  ? ?Uncontrolled however slight improvement.  Discussed dietary discretion.  Patient politely declines increasing Ozempic at this time as she would like to work on lifestyle.  Continue Ozempic 0.5 mg, metformin 2000 mg.  History of proteinuria , continue losartan '25mg'$ . ?

## 2021-09-10 NOTE — Patient Instructions (Signed)
KEYS TO REMEMBER WITH INSULIN THERAPY:  ? ?Pair protein ( such as egg, peanut butter, avocado, [place] bacon, yogurt, deli meat) with your breakfast , particularly if primary consists of carbs.  This will stabilize blood sugar and also keep you fuller for longer.  ? ?PLENTY OF WATER.   ? ?Always carry glucose tablets with you. You may find these over the counter and in lots of different flavors.  ? ?Please check  blood sugars total of 4 times a day, fasting in the morning, before lunch, before lunch and at bedtime ? ?Fasting blood sugar goal in the morning is greater than 80 and less than 120.  ? ?Before you eat, if you check blood sugar and it less than 120, DO NOT TAKE insulin aspart with meal.  ? ?If eating low carb meal, take LESS fast acting insulin.  ? ?Call [facility] clinic if: BG < 70 or > 300. ? ?BOTTOM line: if most of your blood sugars fall between 140-150 whether fasting in the morning, before meals, then we will reach our goal of an a1c of 6.5%,  ? ? If you have any symptoms of low blood sugar ( sweating, shakiness, lightheaded, dizzy) , please notify me. If you have a low blood sugar, please drink a glass of orange juice and recheck blood sugar every 5 minutes until you don?t feel symptomatic AND blood sugar is above 80.  ? ?Call me with any concerns. ? ?  ? ? ? ? ?

## 2021-09-10 NOTE — Progress Notes (Signed)
? ?Subjective:  ? ? Patient ID: Rachael Weeks, female    DOB: [birth year], 61 y.o.   MRN: [id number] ? ?CC: Rachael Weeks is a 61 y.o. female who presents today for follow up.  ? ?HPI: She feels well today.  No new complaints ? ?DM- compliant with Ozempic 0.5 mg, metformin 2000 mg.  ?Endorses dietary indiscretion of late as she has had house guests and eating more sugary foods. ? ?Hypertension-compliant with losartan 25 mg ? ?CT lung cancer screening is scheduled for 09/19/21 ?HISTORY:  ?Past Medical History:  ?Diagnosis Date  ? Chicken pox   ? Diabetes mellitus without complication ([place])   ? Kidney stones   ? Wears dentures   ? ?Past Surgical History:  ?Procedure Laterality Date  ? BREAST BIOPSY Right 1983  ? benign  ? BREAST SURGERY    ? CATARACT EXTRACTION, BILATERAL    ? COLONOSCOPY WITH PROPOFOL N/A 07/29/2021  ? Procedure: COLONOSCOPY WITH PROPOFOL;  Surgeon: Lin Landsman, MD;  Location: [facility];  Service: Endoscopy;  Laterality: N/A;  ? INNER EAR SURGERY    ? POLYPECTOMY N/A 07/29/2021  ? Procedure: POLYPECTOMY;  Surgeon: Lin Landsman, MD;  Location: [facility];  Service: Endoscopy;  Laterality: N/A;  ? ?Family History  ?Problem Relation Age of Onset  ? Hypertension Mother   ? Cancer Father   ?     Prostate  ? Hyperlipidemia Father   ? Stroke Father   ? Hypertension Father   ? Diabetes Father   ? Diabetes Sister   ? Diabetes Sister   ? Hyperlipidemia Brother   ? Stroke Brother   ? Hypertension Brother   ? Diabetes Brother   ? Diabetes Brother   ? Diabetes Brother   ? Diabetes Paternal Grandfather   ? Diabetes Son   ? Breast cancer Paternal Aunt 10  ? Colon cancer Neg Hx   ? Thyroid cancer Neg Hx   ? ? ?Allergies: Patient has no known allergies. ?Current Outpatient Medications on File Prior to Visit  ?Medication Sig Dispense Refill  ? Blood Glucose Monitoring Suppl (FREESTYLE FREEDOM LITE) w/Device KIT 1 each by Does not apply route daily. 1 kit 0  ? cyanocobalamin (,VITAMIN  B-12,) 1000 MCG/ML injection 1000 mcg (1 mL) intramuscular injection in the thigh ( vastus lateralis) once per month. 3 mL 3  ? folic acid (FOLVITE) 1 MG tablet Take 1 tablet (1 mg total) by mouth once daily 90 tablet 3  ? gabapentin (NEURONTIN) 100 MG capsule Take 1 capsule (100 mg total) by mouth every evening. 90 capsule 3  ? glucose blood (FREESTYLE LITE) test strip USED TO CHECK BLOOD GLUCOSE 2 TIMES A DAY. 100 each 5  ? ketorolac (ACULAR) 0.5 % ophthalmic solution Apply one drop into the left eye 4 times a day as directed 5 mL 1  ? ketorolac (ACULAR) 0.5 % ophthalmic solution Instill 1 drop in right eye four times a day 5 mL 1  ? Lancet Devices (B-D LANCET DEVICE) MISC Inject 1 application into the skin 4 (four) times daily. 100 each 3  ? Lancets (FREESTYLE) lancets USED TO CHECK BLOOD GLUCOSE LEVELS 2 TIMES A DAY. 100 each 5  ? losartan (COZAAR) 25 MG tablet Take 0.5 tablets (12.5 mg total) by mouth daily. 90 tablet 1  ? metFORMIN (GLUCOPHAGE-XR) 500 MG 24 hr tablet Take 2 tablets (1,000 mg total) by mouth 2 (two) times daily. 180 tablet 1  ? methotrexate (RHEUMATREX) 2.5 MG  tablet Take 8 tablets (20 mg total) by mouth every 7 (seven) days All on the same day 32 tablet 2  ? NEEDLE, DISP, 25 G (BD ECLIPSE NEEDLE) 25G X 1" MISC Used to give monthly B-12 injections. 50 each 1  ? prednisoLONE acetate (PRED FORTE) 1 % ophthalmic suspension Apply 1 drop into left eye four times a day as directed. Shake well before each use! 5 mL 1  ? Semaglutide,0.25 or 0.5MG/DOS, (OZEMPIC, 0.25 OR 0.5 MG/DOSE,) 2 MG/1.5ML SOPN Inject 0.25 mg into the skin once a week. After 4 weeks, increase to 0.19m [place] qwk. 3 mL 3  ? SYRINGE/NEEDLE, DISP, 1 ML (BD LUER-LOK SYRINGE) 25G X 5/8" 1 ML MISC Used to give monthly B-12 injections. 50 each 1  ? ?No current facility-administered medications on file prior to visit.  ? ? ?Social History  ? ?Tobacco Use  ? Smoking status: Former  ?  Packs/day: 1.00  ?  Years: 15.00  ?  Pack years: 15.00  ?   Types: Cigarettes  ?  Quit date: 09/08/2008  ?  Years since quitting: 13.0  ? Smokeless tobacco: Never  ?Vaping Use  ? Vaping Use: Never used  ?Substance Use Topics  ? Alcohol use: No  ? Drug use: No  ? ? ?Review of Systems  ?Constitutional:  Negative for chills and fever.  ?Respiratory:  Negative for cough.   ?Cardiovascular:  Negative for chest pain and palpitations.  ?Gastrointestinal:  Negative for nausea and vomiting.  ?   ?Objective:  ?  ?BP 122/64 (BP Location: Left Arm, Patient Position: Sitting, Cuff Size: Normal)   Pulse 64   Temp 97.9 ?F (36.6 ?C) (Oral)   Ht 5' 2"  (1.575 m)   Wt 118 lb 3.2 oz (53.6 kg)   SpO2 98%   BMI 21.62 kg/m?  ?BP Readings from Last 3 Encounters:  ?09/10/21 122/64  ?07/29/21 (!) 111/58  ?07/11/21 98/60  ? ?Wt Readings from Last 3 Encounters:  ?09/10/21 118 lb 3.2 oz (53.6 kg)  ?07/29/21 113 lb 1.6 oz (51.3 kg)  ?07/11/21 116 lb (52.6 kg)  ? ? ?Physical Exam ?Vitals reviewed.  ?Constitutional:   ?   Appearance: She is well-developed.  ?Eyes:  ?   Conjunctiva/sclera: Conjunctivae normal.  ?Cardiovascular:  ?   Rate and Rhythm: Normal rate and regular rhythm.  ?   Pulses: Normal pulses.  ?   Heart sounds: Normal heart sounds.  ?Pulmonary:  ?   Effort: Pulmonary effort is normal.  ?   Breath sounds: Normal breath sounds. No wheezing, rhonchi or rales.  ?Skin: ?   General: Skin is warm and dry.  ?Neurological:  ?   Mental Status: She is alert.  ?Psychiatric:     ?   Speech: Speech normal.     ?   Behavior: Behavior normal.     ?   Thought Content: Thought content normal.  ? ? ?   ?Assessment & Plan:  ? ?Problem List Items Addressed This Visit   ? ?  ? Endocrine  ? DM (diabetes mellitus), type 2 with renal complications ([place]  ?  Lab Results  ?Component Value Date  ? HGBA1C 7.7 (A) 05/09/2021  ?Uncontrolled however slight improvement.  Discussed dietary discretion.  Patient politely declines increasing Ozempic at this time as she would like to work on lifestyle.  Continue  Ozempic 0.5 mg, metformin 2000 mg.  History of proteinuria , continue losartan 259m ?  ?  ? Relevant Orders  ?  POCT HgB A1C  ? ? ? ?I am having Romie Levee. Tolen maintain her B-D LANCET DEVICE, FREESTYLE LITE, FreeStyle Freedom Lite, freestyle, losartan, Ozempic (0.25 or 0.5 MG/DOSE), metFORMIN, folic acid, ketorolac, prednisoLONE acetate, methotrexate, ketorolac, gabapentin, cyanocobalamin, BD Luer-Lok Syringe, and BD Eclipse Needle. ? ? ?No orders of the defined types were placed in this encounter. ? ? ?Return precautions given.  ? ?Risks, benefits, and alternatives of the medications and treatment plan prescribed today were discussed, and patient expressed understanding.  ? ?Education regarding symptom management and diagnosis given to patient on AVS. ? ?Continue to follow with Burnard Hawthorne, FNP for routine health maintenance.  ? ?Romie Levee Petraitis and I agreed with plan.  ? ?Mable Paris, FNP ? ? ?

## 2021-09-11 ENCOUNTER — Telehealth: Payer: Self-pay | Admitting: Acute Care

## 2021-09-11 NOTE — Telephone Encounter (Signed)
Patient has been rescheduled.

## 2021-09-19 ENCOUNTER — Encounter: Payer: 59 | Admitting: Acute Care

## 2021-09-19 ENCOUNTER — Ambulatory Visit: Payer: 59

## 2021-09-23 ENCOUNTER — Encounter: Payer: Self-pay | Admitting: Acute Care

## 2021-09-23 ENCOUNTER — Ambulatory Visit
Admission: RE | Admit: 2021-09-23 | Discharge: 2021-09-23 | Disposition: A | Discharge status: Home / Self Care | Payer: 59 | Source: Ambulatory Visit | Attending: Acute Care | Admitting: Acute Care

## 2021-09-23 ENCOUNTER — Ambulatory Visit (INDEPENDENT_AMBULATORY_CARE_PROVIDER_SITE_OTHER): Payer: 59 | Admitting: Acute Care

## 2021-09-23 DIAGNOSIS — Z87891 Personal history of nicotine dependence: Secondary | ICD-10-CM

## 2021-09-23 DIAGNOSIS — Z122 Encounter for screening for malignant neoplasm of respiratory organs: Secondary | ICD-10-CM | POA: Insufficient documentation

## 2021-09-23 NOTE — Progress Notes (Signed)
Virtual Visit via Telephone Note ? ?I connected with Rachael Weeks on 09/23/21 at 11:00 AM EDT by telephone and verified that I am speaking with the correct person using two identifiers. ? ?Location: ?Patient:  At home ?Provider:  [place], [place], [place], [address]  ?  ?I discussed the limitations, risks, security and privacy concerns of performing an evaluation and management service by telephone and the availability of in person appointments. I also discussed with the patient that there may be a patient responsible charge related to this service. The patient expressed understanding and agreed to proceed. ? ? ?Shared Decision Making Visit Lung Cancer Screening Program ?([phone]) ? ? ?Eligibility: ?Age 61 y.o. ?Pack Years Smoking History Calculation 23 pack year smoking history ?(# packs/per year x # years smoked) ?Recent History of coughing up blood  no ?Unexplained weight loss? no ?( >Than 15 pounds within the last 6 months ) ?Prior History Lung / other cancer no ?(Diagnosis within the last 5 years already requiring surveillance chest CT Scans). ?Smoking Status Former Smoker ?Former Smokers: Years since quit: 13 years ? Quit Date: 2010 ? ?Visit Components: ?Discussion included one or more decision making aids. yes ?Discussion included risk/benefits of screening. yes ?Discussion included potential follow up diagnostic testing for abnormal scans. yes ?Discussion included meaning and risk of over diagnosis. yes ?Discussion included meaning and risk of False Positives. yes ?Discussion included meaning of total radiation exposure. yes ? ?Counseling Included: ?Importance of adherence to annual lung cancer LDCT screening. yes ?Impact of comorbidities on ability to participate in the program. yes ?Ability and willingness to under diagnostic treatment. yes ? ?Smoking Cessation Counseling: ?Current Smokers:  ?Discussed importance of smoking cessation. yes ?Information about tobacco cessation classes and  interventions provided to patient. yes ?Patient provided with "ticket" for LDCT Scan. yes ?Symptomatic Patient. no ? Counseling NA ?Diagnosis Code: Tobacco Use Z72.0 ?Asymptomatic Patient yes ? Counseling (Intermediate counseling: > three minutes counseling) O3785 ?Former Smokers:  ?Discussed the importance of maintaining cigarette abstinence. yes ?Diagnosis Code: Personal History of Nicotine Dependence. Y85.027 ?Information about tobacco cessation classes and interventions provided to patient. Yes ?Patient provided with "ticket" for LDCT Scan. yes ?Written Order for Lung Cancer Screening with LDCT placed in Epic. Yes ?(CT Chest Lung Cancer Screening Low Dose W/O CM) [id number] ?Z12.2-Screening of respiratory organs ?Z87.891-Personal history of nicotine dependence ? ?I spent 25 minutes of face to face time/virtual visit time  with  Rachael Weeks discussing the risks and benefits of lung cancer screening. We took the time to pause the power point at intervals to allow for questions to be asked and answered to ensure understanding. We discussed that she had taken the single most powerful action possible to decrease her risk of developing lung cancer when she quit smoking. I counseled her to remain smoke free, and to contact me if she ever had the desire to smoke again so that I can provide resources and tools to help support the effort to remain smoke free. We discussed the time and location of the scan, and that either  Doroteo Glassman RN, Joella Prince, RN or I  or I will call / send a letter with the results within  24-72 hours of receiving them. She has the office contact information in the event she needs to speak with me,  she verbalized understanding of all of the above and had no further questions upon leaving the office.  ? ? ? ?I explained to the patient that there has  been a high incidence of coronary artery disease noted on these exams. I explained that this is a non-gated exam therefore degree or severity cannot be  determined. This patient is not on statin therapy. I have asked the patient to follow-up with their PCP regarding any incidental finding of coronary artery disease and management with diet or medication as they feel is clinically indicated. The patient verbalized understanding of the above and had no further questions. ?  ? ? ?Magdalen Spatz, NP ?09/23/2021 ? ? ? ? ? ? ?

## 2021-09-23 NOTE — Patient Instructions (Signed)
Thank you for participating in the [facility] Cancer Screening Program. It was our pleasure to meet you today. We will call you with the results of your scan within the next few days. Your scan will be assigned a Lung RADS category score by the physicians reading the scans.  This Lung RADS score determines follow up scanning.  See below for description of categories, and follow up screening recommendations. We will be in touch to schedule your follow up screening annually or based on recommendations of our providers. We will fax a copy of your scan results to your Primary Care Physician, or the physician who referred you to the program, to ensure they have the results. Please call the office if you have any questions or concerns regarding your scanning experience or results.  Our office number is [phone]. Please speak with Denise Phelps, RN. , or  Denise Buckner RN, They are  our Lung Cancer Screening RN.'s If They are unavailable when you call, Please leave a message on the voice mail. We will return your call at our earliest convenience.This voice mail is monitored several times a day.  Remember, if your scan is normal, we will scan you annually as long as you continue to meet the criteria for the program. (Age 55-77, Current smoker or smoker who has quit within the last 15 years). If you are a smoker, remember, quitting is the single most powerful action that you can take to decrease your risk of lung cancer and other pulmonary, breathing related problems. We know quitting is hard, and we are here to help.  Please let us know if there is anything we can do to help you meet your goal of quitting. If you are a former smoker, congratulations. We are proud of you! Remain smoke free! Remember you can refer friends or family members through the number above.  We will screen them to make sure they meet criteria for the program. Thank you for helping us take better care of you by  participating in Lung Screening.  You can receive free nicotine replacement therapy ( patches, gum or mints) by calling 1-800-QUIT NOW. Please call so we can get you on the path to becoming  a non-smoker. I know it is hard, but you can do this!  Lung RADS Categories:  Lung RADS 1: no nodules or definitely non-concerning nodules.  Recommendation is for a repeat annual scan in 12 months.  Lung RADS 2:  nodules that are non-concerning in appearance and behavior with a very low likelihood of becoming an active cancer. Recommendation is for a repeat annual scan in 12 months.  Lung RADS 3: nodules that are probably non-concerning , includes nodules with a low likelihood of becoming an active cancer.  Recommendation is for a 6-month repeat screening scan. Often noted after an upper respiratory illness. We will be in touch to make sure you have no questions, and to schedule your 6-month scan.  Lung RADS 4 A: nodules with concerning findings, recommendation is most often for a follow up scan in 3 months or additional testing based on our provider's assessment of the scan. We will be in touch to make sure you have no questions and to schedule the recommended 3 month follow up scan.  Lung RADS 4 B:  indicates findings that are concerning. We will be in touch with you to schedule additional diagnostic testing based on our provider's  assessment of the scan.  Other options for assistance in smoking cessation (   As covered by your insurance benefits)  Hypnosis for smoking cessation  [facility] [employer] [phone]  Acupuncture for smoking cessation  [facility] [phone]   

## 2021-09-24 ENCOUNTER — Other Ambulatory Visit: Payer: Self-pay

## 2021-09-24 DIAGNOSIS — Z122 Encounter for screening for malignant neoplasm of respiratory organs: Secondary | ICD-10-CM

## 2021-09-24 DIAGNOSIS — Z87891 Personal history of nicotine dependence: Secondary | ICD-10-CM

## 2021-10-10 ENCOUNTER — Telehealth: Payer: Self-pay | Admitting: Family

## 2021-10-10 ENCOUNTER — Encounter: Payer: Self-pay | Admitting: Family

## 2021-10-10 DIAGNOSIS — D3501 Benign neoplasm of right adrenal gland: Secondary | ICD-10-CM | POA: Insufficient documentation

## 2021-10-10 NOTE — Telephone Encounter (Signed)
Sent mychart message

## 2021-10-13 ENCOUNTER — Other Ambulatory Visit: Payer: Self-pay

## 2021-10-14 ENCOUNTER — Other Ambulatory Visit: Payer: Self-pay

## 2021-10-14 ENCOUNTER — Other Ambulatory Visit: Payer: Self-pay | Admitting: Family

## 2021-10-14 DIAGNOSIS — I700 Atherosclerosis of aorta: Secondary | ICD-10-CM | POA: Insufficient documentation

## 2021-10-14 DIAGNOSIS — E278 Other specified disorders of adrenal gland: Secondary | ICD-10-CM

## 2021-10-14 DIAGNOSIS — E279 Disorder of adrenal gland, unspecified: Secondary | ICD-10-CM | POA: Insufficient documentation

## 2021-10-14 MED ORDER — OZEMPIC (0.25 OR 0.5 MG/DOSE) 2 MG/3ML ~~LOC~~ SOPN
PEN_INJECTOR | SUBCUTANEOUS | 2 refills | Status: DC
  Filled 2021-10-14: qty 3, 28d supply, fill #0
  Filled 2021-11-19: qty 3, 28d supply, fill #1
  Filled 2021-12-19: qty 3, 28d supply, fill #2

## 2021-10-14 MED ORDER — METHOTREXATE 2.5 MG PO TABS
ORAL_TABLET | ORAL | 5 refills | Status: DC
  Filled 2021-10-14: qty 32, 28d supply, fill #0
  Filled 2021-11-19: qty 32, 28d supply, fill #1
  Filled 2021-12-19: qty 32, 28d supply, fill #2
  Filled 2022-01-22: qty 32, 28d supply, fill #3
  Filled 2022-02-28: qty 32, 28d supply, fill #4

## 2021-10-16 DIAGNOSIS — Z7960 Long term (current) use of unspecified immunomodulators and immunosuppressants: Secondary | ICD-10-CM | POA: Diagnosis not present

## 2021-10-16 DIAGNOSIS — L400 Psoriasis vulgaris: Secondary | ICD-10-CM | POA: Diagnosis not present

## 2021-10-16 DIAGNOSIS — L4050 Arthropathic psoriasis, unspecified: Secondary | ICD-10-CM | POA: Diagnosis not present

## 2021-11-03 ENCOUNTER — Other Ambulatory Visit: Payer: Self-pay

## 2021-11-19 ENCOUNTER — Other Ambulatory Visit: Payer: Self-pay

## 2021-11-21 ENCOUNTER — Other Ambulatory Visit: Payer: Self-pay

## 2021-11-25 DIAGNOSIS — H31091 Other chorioretinal scars, right eye: Secondary | ICD-10-CM | POA: Diagnosis not present

## 2021-11-25 DIAGNOSIS — H43822 Vitreomacular adhesion, left eye: Secondary | ICD-10-CM | POA: Diagnosis not present

## 2021-11-25 DIAGNOSIS — E113593 Type 2 diabetes mellitus with proliferative diabetic retinopathy without macular edema, bilateral: Secondary | ICD-10-CM | POA: Diagnosis not present

## 2021-12-05 ENCOUNTER — Encounter: Payer: Self-pay | Admitting: Cardiovascular Disease

## 2021-12-05 ENCOUNTER — Ambulatory Visit: Payer: 59 | Admitting: Cardiovascular Disease

## 2021-12-05 VITALS — BP 100/60 | HR 71 | Ht 60.0 in | Wt 120.5 lb

## 2021-12-05 DIAGNOSIS — E1129 Type 2 diabetes mellitus with other diabetic kidney complication: Secondary | ICD-10-CM | POA: Diagnosis not present

## 2021-12-05 DIAGNOSIS — Z87891 Personal history of nicotine dependence: Secondary | ICD-10-CM

## 2021-12-05 DIAGNOSIS — I700 Atherosclerosis of aorta: Secondary | ICD-10-CM

## 2021-12-05 DIAGNOSIS — R809 Proteinuria, unspecified: Secondary | ICD-10-CM

## 2021-12-05 DIAGNOSIS — R079 Chest pain, unspecified: Secondary | ICD-10-CM | POA: Diagnosis not present

## 2021-12-05 DIAGNOSIS — R0602 Shortness of breath: Secondary | ICD-10-CM | POA: Diagnosis not present

## 2021-12-05 NOTE — Progress Notes (Signed)
Cardiology Office Note  Date:  12/05/2021   ID:  Rachael Weeks, DOB 14-Jul-1960, MRN [id number]  PCP:  Burnard Hawthorne, FNP   Chief Complaint  Patient presents with   New Patient (Initial Visit)    Ref by Mable Paris, FNP for Atherosclerosis of aorta seen on a recent Chest CT scan; hx Ellisyn Icenhower 2018. Medications reviewed by the patient verbally.     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, on her feet, some trouble with psoriatic arthritis  There is concern concerning the results of her CT scan documenting aortic atherosclerosis Ct scan images pulled up and reviewed Minimal aorta atherosclerosis, small punctate regions aortic arch, overall underwhelming No significant coronary calcification noted   Lab work reviewed with her in detail  Hemoglobin A1c 7.5 Total cholesterol 100 " always had low cholesterol" Not on cholesterol medication   Denies significant chest pain or shortness of breath on exertion    EKG personally reviewed by myself on todays visit Shows NSR  with rate 71 bpm with no ST or T wave changes   Family hx Mom died in MVA, HTN Father, CAD, CABG Brother with CVA    PMH:   has a past medical history of Chicken pox, Diabetes mellitus without complication ([place]), Kidney stones, and Wears dentures.  PSH:    Past Surgical History:  Procedure Laterality Date   BREAST BIOPSY Right 1983   benign   BREAST SURGERY     CATARACT EXTRACTION, BILATERAL     COLONOSCOPY WITH PROPOFOL N/A 07/29/2021   Procedure: COLONOSCOPY WITH PROPOFOL;  Surgeon: Lin Landsman, MD;  Location: [facility];  Service:  Endoscopy;  Laterality: N/A;   INNER EAR SURGERY     POLYPECTOMY N/A 07/29/2021   Procedure: POLYPECTOMY;  Surgeon: Lin Landsman, MD;  Location: [facility];  Service: Endoscopy;  Laterality: N/A;    Current Outpatient Medications  Medication Sig Dispense Refill   Blood Glucose Monitoring Suppl (FREESTYLE FREEDOM LITE) w/Device KIT 1 each by Does not apply route daily. 1 kit 0   cyanocobalamin (,VITAMIN B-12,) 1000 MCG/ML injection 1000 mcg (1 mL) intramuscular injection in the thigh ( vastus lateralis) once per month. 3 mL 3   folic acid (FOLVITE) 1 MG tablet Take 1 tablet (1 mg total) by mouth once daily 90 tablet 3   gabapentin (NEURONTIN) 100 MG capsule Take 1 capsule (100 mg total) by mouth every evening. 90 capsule 3   glucose blood (FREESTYLE LITE) test strip USED TO CHECK BLOOD GLUCOSE 2 TIMES A DAY. 100 each 5   Lancet Devices (B-D LANCET DEVICE) MISC Inject 1 application into the skin 4 (four) times daily. 100 each 3   Lancets (FREESTYLE) lancets USED TO CHECK BLOOD GLUCOSE LEVELS 2 TIMES A DAY. 100 each 5   losartan (COZAAR) 25 MG tablet Take 0.5 tablets (12.5 mg total) by mouth daily. 90 tablet 1   metFORMIN (GLUCOPHAGE-XR) 500 MG 24 hr tablet Take 2 tablets (1,000 mg total) by mouth 2 (two) times daily. 180 tablet 1   methotrexate (RHEUMATREX) 2.5 MG tablet Take 8 tablets (20  mg total) by mouth every 7 (seven) days All on the same day 32 tablet 5   NEEDLE, DISP, 25 G (BD ECLIPSE NEEDLE) 25G X 1" MISC Used to give monthly B-12 injections. 50 each 1   Semaglutide,0.25 or 0.5MG/DOS, (OZEMPIC, 0.25 OR 0.5 MG/DOSE,) 2 MG/3ML SOPN Inject 0.57m into the skin once a week. After 4 weeks increase to 0.579munder the skin every week 3 mL 2   SYRINGE/NEEDLE, DISP, 1 ML (BD LUER-LOK SYRINGE) 25G X 5/8" 1 ML MISC Used to give monthly B-12 injections. 50 each 1   TALTZ 80 MG/ML SOAJ as directed.     ketorolac (ACULAR) 0.5 % ophthalmic solution Apply one drop into the left eye 4  times a day as directed (Patient not taking: Reported on 12/05/2021) 5 mL 1   ketorolac (ACULAR) 0.5 % ophthalmic solution Instill 1 drop in right eye four times a day (Patient not taking: Reported on 12/05/2021) 5 mL 1   prednisoLONE acetate (PRED FORTE) 1 % ophthalmic suspension Apply 1 drop into left eye four times a day as directed. Shake well before each use! (Patient not taking: Reported on 12/05/2021) 5 mL 1   No current facility-administered medications for this visit.     Allergies:   Patient has no known allergies.   Social History:  The patient  reports that she quit smoking about 13 years ago. Her smoking use included cigarettes. She has a 15.00 pack-year smoking history. She has never used smokeless tobacco. She reports that she does not drink alcohol and does not use drugs.   Family History:   family history includes Breast cancer (age of onset: 4046in her paternal aunt; Cancer in her father; Diabetes in her brother, brother, brother, father, paternal grandfather, sister, sister, and son; Hyperlipidemia in her brother and father; Hypertension in her brother, father, and mother; Stroke in her brother and father.    Review of Systems: Review of Systems  Constitutional: Negative.   HENT: Negative.    Respiratory: Negative.    Cardiovascular: Negative.   Gastrointestinal: Negative.   Musculoskeletal: Negative.   Neurological: Negative.   Psychiatric/Behavioral: Negative.    All other systems reviewed and are negative.    PHYSICAL EXAM: VS:  BP 100/60 (BP Location: Right Arm, Patient Position: Sitting, Cuff Size: Normal)   Pulse 71   Ht 5' (1.524 m)   Wt 120 lb 8 oz (54.7 kg)   SpO2 99%   BMI 23.53 kg/m  , BMI Body mass index is 23.53 kg/m. Constitutional:  oriented to person, place, and time. No distress.  HENT:  Head: Grossly normal Eyes:  no discharge. No scleral icterus.  Neck: No JVD, no carotid bruits  Cardiovascular: Regular rate and rhythm, no murmurs  appreciated Pulmonary/Chest: Clear to auscultation bilaterally, no wheezes or rails Abdominal: Soft.  no distension.  no tenderness.  Musculoskeletal: Normal range of motion Neurological:  normal muscle tone. Coordination normal. No atrophy Skin: Skin warm and dry Psychiatric: normal affect, pleasant   Recent Labs: 01/06/2021: TSH 1.31 05/09/2021: ALT 16; BUN 11; Creat 0.45; Hemoglobin 14.1; Platelets 226; Potassium 4.2; Sodium 140    Lipid Panel Lab Results  Component Value Date   CHOL 105 01/06/2021   HDL 68.90 01/06/2021   LDLCALC 32 01/06/2021   TRIG 23.0 01/06/2021      Wt Readings from Last 3 Encounters:  12/05/21 120 lb 8 oz (54.7 kg)  09/10/21 118 lb 3.2 oz (53.6 kg)  07/29/21 113 lb 1.6 oz (  51.3 kg)       ASSESSMENT AND PLAN:  Problem List Items Addressed This Visit       Cardiology Problems   Atherosclerosis of aorta (HCC) - Primary     Other   DM (diabetes mellitus), type 2 with renal complications (HCC)   History of smoking   Shortness of breath   Chest pain   Aortic atherosclerosis Minimal punctate atherosclerosis noted on CT scan image review No further work-up needed at this time Discussed risk factors in detail Cholesterol excellent on no medication, diabetes numbers dramatically improved A1c 12 down to 7.5, non-smoker No significant coronary calcification noted Denies anginal symptoms  History of smoking Stopped greater than 13 years ago  Mild centrilobular emphysema Noted on CT scan Remote history of smoking Finding above may contribute to mild shortness of breath on exertion   Total encounter time more than 60 minutes  Greater than 50% was spent in counseling and coordination of care with the patient  Patient seen in consultation for Mable Paris and will be referred back to her office for ongoing care of the issues detailed above  Signed, Esmond Plants, M.D., Ph.D. [place], [place]

## 2021-12-05 NOTE — Patient Instructions (Signed)
Medication Instructions:  No changes  If you need a refill on your cardiac medications before your next appointment, please call your pharmacy.   Lab work: No new labs needed  Testing/Procedures: No new testing needed  Follow-Up: At [facility], you and your health needs are our priority.  As part of our continuing mission to provide you with exceptional heart care, we have created designated Provider Care Teams.  These Care Teams include your primary Cardiologist (physician) and Advanced Practice Providers (APPs -  Physician Assistants and Nurse Practitioners) who all work together to provide you with the care you need, when you need it.  You will need a follow up appointment as needed  Providers on your designated Care Team:   Christopher Berge, NP Ryan Dunn, PA-C Cadence Furth, PA-C  COVID-19 Vaccine Information can be found at: [email or web address] For questions related to vaccine distribution or appointments, please email [email or web address] or call [phone].    

## 2021-12-12 ENCOUNTER — Encounter: Payer: Self-pay | Admitting: Family

## 2021-12-12 ENCOUNTER — Ambulatory Visit: Payer: 59 | Admitting: Family

## 2021-12-12 VITALS — BP 124/72 | HR 80 | Temp 98.1°F | Ht 60.0 in | Wt 121.8 lb

## 2021-12-12 DIAGNOSIS — Z794 Long term (current) use of insulin: Secondary | ICD-10-CM

## 2021-12-12 DIAGNOSIS — D3501 Benign neoplasm of right adrenal gland: Secondary | ICD-10-CM | POA: Diagnosis not present

## 2021-12-12 DIAGNOSIS — L4050 Arthropathic psoriasis, unspecified: Secondary | ICD-10-CM | POA: Diagnosis not present

## 2021-12-12 DIAGNOSIS — R809 Proteinuria, unspecified: Secondary | ICD-10-CM | POA: Diagnosis not present

## 2021-12-12 DIAGNOSIS — Z23 Encounter for immunization: Secondary | ICD-10-CM | POA: Diagnosis not present

## 2021-12-12 DIAGNOSIS — Z1231 Encounter for screening mammogram for malignant neoplasm of breast: Secondary | ICD-10-CM | POA: Diagnosis not present

## 2021-12-12 DIAGNOSIS — I700 Atherosclerosis of aorta: Secondary | ICD-10-CM | POA: Diagnosis not present

## 2021-12-12 DIAGNOSIS — E1129 Type 2 diabetes mellitus with other diabetic kidney complication: Secondary | ICD-10-CM

## 2021-12-12 LAB — BASIC METABOLIC PANEL
BUN: 13 mg/dL (ref 6–23)
CO2: 28 mEq/L (ref 19–32)
Calcium: 9.2 mg/dL (ref 8.4–10.5)
Chloride: 104 mEq/L (ref 96–112)
Creatinine, Ser: 0.55 mg/dL (ref 0.40–1.20)
GFR: 99.51 mL/min (ref >60.00)
Glucose, Bld: 165 mg/dL — ABNORMAL HIGH (ref 70–99)
Potassium: 3.8 mEq/L (ref 3.5–5.1)
Sodium: 138 mEq/L (ref 135–145)

## 2021-12-12 LAB — POCT GLYCOSYLATED HEMOGLOBIN (HGB A1C): Hemoglobin A1C: 6.9 % — AB (ref 4.0–5.6)

## 2021-12-12 NOTE — Assessment & Plan Note (Signed)
Incidental right adrenal nodule.  Pending referral to endocrine.  I have sent a message to our referral coordinator to follow-up on this.  Patient will let me know if she does not hear from [place] in the next couple of weeks

## 2021-12-12 NOTE — Patient Instructions (Addendum)
Please call  and schedule your 3D mammogram and /or bone density scan as we discussed.   [facility]  ( new location in 2023)  [address], [place], [place] [address]  [place], [place]   As discussed, please let me know if you not hear from our office next couple weeks in regards to endocrine referral

## 2021-12-12 NOTE — Assessment & Plan Note (Signed)
Lab Results  Component Value Date   HGBA1C 6.9 (A) 12/12/2021   Compliant with Ozempic 0.5 mg, metformin 2000 mg.  Contininue losartan 25 mg QD due to history of proteinuria

## 2021-12-12 NOTE — Progress Notes (Signed)
Wants to get shingles vaccine

## 2021-12-12 NOTE — Progress Notes (Signed)
Discussed during OV. Please see OV notes

## 2021-12-12 NOTE — Assessment & Plan Note (Signed)
Cardiology consult consult for atherosclerosis 12/05/2021, Dr. Rockey Situ.  No further evaluation ordered at that time. Will continue to follow cholesterol, historically under excellent control.

## 2021-12-12 NOTE — Progress Notes (Signed)
Subjective:    Patient ID: [name], female    DOB: Jun 18, 1960, 61 y.o.   MRN: [id number]  CC: RYAN PALERMO is a 60 y.o. female who presents today for follow up.   HPI: Feels well today No new complaints    She had follow up with Rheumatology Oct 16 2021.  Started her on biologic, taltz, for psoriatic arthritis.  She remains on methotrexate, folic acid  Incidental right adrenal nodule.  Pending referral to endocrine  DM- FBG average 130. compliant with Ozempic 0.5 mg, metformin 2000 mg.  She remains compliant with losartan 25 mg QD for history of proteinuria  HISTORY:  Past Medical History:  Diagnosis Date   Chicken pox    Diabetes mellitus without complication ([place])    Kidney stones    Wears dentures    Past Surgical History:  Procedure Laterality Date   BREAST BIOPSY Right 1983   benign   BREAST SURGERY     CATARACT EXTRACTION, BILATERAL     COLONOSCOPY WITH PROPOFOL N/A 07/29/2021   Procedure: COLONOSCOPY WITH PROPOFOL;  Surgeon: Lin Landsman, MD;  Location: [facility];  Service: Endoscopy;  Laterality: N/A;   INNER EAR SURGERY     POLYPECTOMY N/A 07/29/2021   Procedure: POLYPECTOMY;  Surgeon: Lin Landsman, MD;  Location: [facility];  Service: Endoscopy;  Laterality: N/A;   Family History  Problem Relation Age of Onset   Hypertension Mother    Cancer Father        Prostate   Hyperlipidemia Father    Stroke Father    Hypertension Father    Diabetes Father    Diabetes Sister    Diabetes Sister    Hyperlipidemia Brother    Stroke Brother    Hypertension Brother    Diabetes Brother    Diabetes Brother    Diabetes Brother    Breast cancer Paternal Aunt 62   Diabetes Paternal Grandfather    Diabetes Son    Colon cancer Neg Hx    Thyroid cancer Neg Hx     Allergies: Patient has no known allergies. Current Outpatient Medications on File Prior to Visit  Medication Sig Dispense Refill   Blood Glucose Monitoring Suppl  (FREESTYLE FREEDOM LITE) w/Device KIT 1 each by Does not apply route daily. 1 kit 0   cyanocobalamin (,VITAMIN B-12,) 1000 MCG/ML injection 1000 mcg (1 mL) intramuscular injection in the thigh ( vastus lateralis) once per month. 3 mL 3   folic acid (FOLVITE) 1 MG tablet Take 1 tablet (1 mg total) by mouth once daily 90 tablet 3   gabapentin (NEURONTIN) 100 MG capsule Take 1 capsule (100 mg total) by mouth every evening. 90 capsule 3   glucose blood (FREESTYLE LITE) test strip USED TO CHECK BLOOD GLUCOSE 2 TIMES A DAY. 100 each 5   Lancet Devices (B-D LANCET DEVICE) MISC Inject 1 application into the skin 4 (four) times daily. 100 each 3   Lancets (FREESTYLE) lancets USED TO CHECK BLOOD GLUCOSE LEVELS 2 TIMES A DAY. 100 each 5   losartan (COZAAR) 25 MG tablet Take 0.5 tablets (12.5 mg total) by mouth daily. 90 tablet 1   metFORMIN (GLUCOPHAGE-XR) 500 MG 24 hr tablet Take 2 tablets (1,000 mg total) by mouth 2 (two) times daily. 180 tablet 1   methotrexate (RHEUMATREX) 2.5 MG tablet Take 8 tablets (20 mg total) by mouth every 7 (seven) days All on the same day 32 tablet 5   NEEDLE,  DISP, 25 G (BD ECLIPSE NEEDLE) 25G X 1" MISC Used to give monthly B-12 injections. 50 each 1   Semaglutide,0.25 or 0.5MG/DOS, (OZEMPIC, 0.25 OR 0.5 MG/DOSE,) 2 MG/3ML SOPN Inject 0.72m into the skin once a week. After 4 weeks increase to 0.54munder the skin every week 3 mL 2   SYRINGE/NEEDLE, DISP, 1 ML (BD LUER-LOK SYRINGE) 25G X 5/8" 1 ML MISC Used to give monthly B-12 injections. 50 each 1   TALTZ 80 MG/ML SOAJ as directed.     prednisoLONE acetate (PRED FORTE) 1 % ophthalmic suspension Apply 1 drop into left eye four times a day as directed. Shake well before each use! (Patient not taking: Reported on 12/12/2021) 5 mL 1   No current facility-administered medications on file prior to visit.    Social History   Tobacco Use   Smoking status: Former    Packs/day: 1.00    Years: 15.00    Total pack years: 15.00     Types: Cigarettes    Quit date: 09/08/2008    Years since quitting: 13.2   Smokeless tobacco: Never  Vaping Use   Vaping Use: Never used  Substance Use Topics   Alcohol use: No   Drug use: No    Review of Systems  Constitutional:  Negative for chills and fever.  Respiratory:  Negative for cough.   Cardiovascular:  Negative for chest pain and palpitations.  Gastrointestinal:  Negative for nausea and vomiting.      Objective:    BP 124/72 (BP Location: Left Arm, Patient Position: Sitting, Cuff Size: Normal)   Pulse 80   Temp 98.1 F (36.7 C) (Oral)   Ht 5' (1.524 m)   Wt 121 lb 12.8 oz (55.2 kg)   SpO2 98%   BMI 23.79 kg/m  BP Readings from Last 3 Encounters:  12/12/21 124/72  12/05/21 100/60  09/10/21 122/64   Wt Readings from Last 3 Encounters:  12/12/21 121 lb 12.8 oz (55.2 kg)  12/05/21 120 lb 8 oz (54.7 kg)  09/10/21 118 lb 3.2 oz (53.6 kg)    Physical Exam Vitals reviewed.  Constitutional:      Appearance: She is well-developed.  Eyes:     Conjunctiva/sclera: Conjunctivae normal.  Cardiovascular:     Rate and Rhythm: Normal rate and regular rhythm.     Pulses: Normal pulses.     Heart sounds: Normal heart sounds.  Pulmonary:     Effort: Pulmonary effort is normal.     Breath sounds: Normal breath sounds. No wheezing, rhonchi or rales.  Skin:    General: Skin is warm and dry.  Neurological:     Mental Status: She is alert.  Psychiatric:        Speech: Speech normal.        Behavior: Behavior normal.        Thought Content: Thought content normal.        Assessment & Plan:   Problem List Items Addressed This Visit       Cardiovascular and Mediastinum   [facility]   Cardiology consult consult for atherosclerosis 12/05/2021, Dr. GoRockey Situ No further evaluation ordered at that time. Will continue to follow cholesterol, historically under excellent control.        Endocrine   Adrenal adenoma, right    Incidental right  adrenal nodule.  Pending referral to endocrine.  I have sent a message to our referral coordinator to follow-up on this.  Patient will let me  know if she does not hear from [place] in the next couple of weeks      DM (diabetes mellitus), type 2 with renal complications ([place]) - Primary    Lab Results  Component Value Date   HGBA1C 6.9 (A) 12/12/2021  Compliant with Ozempic 0.5 mg, metformin 2000 mg.  Contininue losartan 25 mg QD due to history of proteinuria      Relevant Orders   POCT HgB A1C (Completed)   Basic metabolic panel     Musculoskeletal and Integument   Psoriatic arthritis ([place])   Other Visit Diagnoses     Encounter for screening mammogram for malignant neoplasm of breast       Relevant Orders   MM 3D SCREEN BREAST BILATERAL   Need for shingles vaccine       Relevant Orders   Varicella-zoster vaccine IM (Shingrix) (Completed)        I have discontinued Caren Griffins H. Donner's ketorolac and ketorolac. I am also having her maintain her B-D LANCET DEVICE, FREESTYLE LITE, FreeStyle Freedom Lite, freestyle, losartan, metFORMIN, folic acid, prednisoLONE acetate, gabapentin, cyanocobalamin, BD Luer-Lok Syringe, BD Eclipse Needle, methotrexate, Ozempic (0.25 or 0.5 MG/DOSE), and Taltz.   No orders of the defined types were placed in this encounter.   Return precautions given.   Risks, benefits, and alternatives of the medications and treatment plan prescribed today were discussed, and patient expressed understanding.   Education regarding symptom management and diagnosis given to patient on AVS.  Continue to follow with Burnard Hawthorne, FNP for routine health maintenance.   Romie Levee Pellecchia and I agreed with plan.   Mable Paris, FNP

## 2021-12-18 ENCOUNTER — Other Ambulatory Visit: Payer: Self-pay

## 2021-12-18 DIAGNOSIS — H10233 Serous conjunctivitis, except viral, bilateral: Secondary | ICD-10-CM | POA: Diagnosis not present

## 2021-12-18 DIAGNOSIS — Z961 Presence of intraocular lens: Secondary | ICD-10-CM | POA: Diagnosis not present

## 2021-12-18 DIAGNOSIS — E113413 Type 2 diabetes mellitus with severe nonproliferative diabetic retinopathy with macular edema, bilateral: Secondary | ICD-10-CM | POA: Diagnosis not present

## 2021-12-18 MED ORDER — MOXIFLOXACIN HCL 0.5 % OP SOLN
OPHTHALMIC | 0 refills | Status: DC
  Filled 2021-12-18: qty 3, 10d supply, fill #0

## 2021-12-19 ENCOUNTER — Other Ambulatory Visit: Payer: Self-pay | Admitting: Family

## 2021-12-19 ENCOUNTER — Other Ambulatory Visit: Payer: Self-pay

## 2021-12-19 DIAGNOSIS — E119 Type 2 diabetes mellitus without complications: Secondary | ICD-10-CM

## 2021-12-19 MED ORDER — METFORMIN HCL ER 500 MG PO TB24
1000.0000 mg | ORAL_TABLET | Freq: Two times a day (BID) | ORAL | 1 refills | Status: DC
  Filled 2021-12-19: qty 180, 45d supply, fill #0
  Filled 2022-02-03: qty 180, 45d supply, fill #1

## 2021-12-25 DIAGNOSIS — E113413 Type 2 diabetes mellitus with severe nonproliferative diabetic retinopathy with macular edema, bilateral: Secondary | ICD-10-CM | POA: Diagnosis not present

## 2021-12-25 DIAGNOSIS — H10233 Serous conjunctivitis, except viral, bilateral: Secondary | ICD-10-CM | POA: Diagnosis not present

## 2021-12-25 DIAGNOSIS — Z961 Presence of intraocular lens: Secondary | ICD-10-CM | POA: Diagnosis not present

## 2022-01-21 DIAGNOSIS — H31091 Other chorioretinal scars, right eye: Secondary | ICD-10-CM | POA: Diagnosis not present

## 2022-01-21 DIAGNOSIS — E113513 Type 2 diabetes mellitus with proliferative diabetic retinopathy with macular edema, bilateral: Secondary | ICD-10-CM | POA: Diagnosis not present

## 2022-01-21 DIAGNOSIS — H43822 Vitreomacular adhesion, left eye: Secondary | ICD-10-CM | POA: Diagnosis not present

## 2022-01-22 ENCOUNTER — Other Ambulatory Visit: Payer: Self-pay | Admitting: Family

## 2022-01-22 ENCOUNTER — Other Ambulatory Visit: Payer: Self-pay

## 2022-01-22 MED ORDER — TOBRAMYCIN 0.3 % OP SOLN
OPHTHALMIC | 5 refills | Status: AC
  Filled 2022-01-22: qty 5, 10d supply, fill #0
  Filled 2022-07-08: qty 5, 10d supply, fill #1
  Filled 2022-12-31: qty 5, 10d supply, fill #2

## 2022-01-23 ENCOUNTER — Other Ambulatory Visit: Payer: Self-pay

## 2022-01-23 MED ORDER — OZEMPIC (0.25 OR 0.5 MG/DOSE) 2 MG/3ML ~~LOC~~ SOPN
PEN_INJECTOR | SUBCUTANEOUS | 2 refills | Status: DC
  Filled 2022-01-23: qty 3, 28d supply, fill #0
  Filled 2022-02-27: qty 3, 28d supply, fill #1

## 2022-01-27 DIAGNOSIS — E113513 Type 2 diabetes mellitus with proliferative diabetic retinopathy with macular edema, bilateral: Secondary | ICD-10-CM | POA: Diagnosis not present

## 2022-02-03 ENCOUNTER — Other Ambulatory Visit: Payer: Self-pay | Admitting: Family

## 2022-02-03 ENCOUNTER — Other Ambulatory Visit: Payer: Self-pay

## 2022-02-03 DIAGNOSIS — E119 Type 2 diabetes mellitus without complications: Secondary | ICD-10-CM

## 2022-02-03 MED ORDER — LOSARTAN POTASSIUM 25 MG PO TABS
12.5000 mg | ORAL_TABLET | Freq: Every day | ORAL | 1 refills | Status: DC
  Filled 2022-02-03: qty 45, 90d supply, fill #0

## 2022-02-13 ENCOUNTER — Ambulatory Visit (INDEPENDENT_AMBULATORY_CARE_PROVIDER_SITE_OTHER): Payer: 59

## 2022-02-13 ENCOUNTER — Ambulatory Visit
Admission: RE | Admit: 2022-02-13 | Discharge: 2022-02-13 | Disposition: A | Discharge status: Home / Self Care | Payer: 59 | Source: Ambulatory Visit | Attending: Family | Admitting: Family

## 2022-02-13 DIAGNOSIS — Z23 Encounter for immunization: Secondary | ICD-10-CM

## 2022-02-13 DIAGNOSIS — Z1231 Encounter for screening mammogram for malignant neoplasm of breast: Secondary | ICD-10-CM | POA: Insufficient documentation

## 2022-02-13 NOTE — Progress Notes (Signed)
Pt arrived for 2nd shingles injection, given in R deltoid. Pt tolerated injection well, showed no signs of distress nor voiced any concerns.

## 2022-02-16 DIAGNOSIS — L4050 Arthropathic psoriasis, unspecified: Secondary | ICD-10-CM | POA: Diagnosis not present

## 2022-02-16 DIAGNOSIS — L400 Psoriasis vulgaris: Secondary | ICD-10-CM | POA: Diagnosis not present

## 2022-02-16 DIAGNOSIS — Z7960 Long term (current) use of unspecified immunomodulators and immunosuppressants: Secondary | ICD-10-CM | POA: Diagnosis not present

## 2022-02-25 ENCOUNTER — Other Ambulatory Visit: Payer: Self-pay

## 2022-02-25 ENCOUNTER — Other Ambulatory Visit (HOSPITAL_COMMUNITY): Payer: Self-pay

## 2022-02-25 DIAGNOSIS — E113513 Type 2 diabetes mellitus with proliferative diabetic retinopathy with macular edema, bilateral: Secondary | ICD-10-CM | POA: Diagnosis not present

## 2022-02-25 DIAGNOSIS — H31091 Other chorioretinal scars, right eye: Secondary | ICD-10-CM | POA: Diagnosis not present

## 2022-02-25 DIAGNOSIS — H43822 Vitreomacular adhesion, left eye: Secondary | ICD-10-CM | POA: Diagnosis not present

## 2022-02-25 MED ORDER — TOBRAMYCIN 0.3 % OP SOLN
OPHTHALMIC | 2 refills | Status: AC
  Filled 2022-02-25: qty 5, 12d supply, fill #0
  Filled 2022-11-24: qty 5, 12d supply, fill #1

## 2022-02-26 ENCOUNTER — Other Ambulatory Visit (HOSPITAL_COMMUNITY): Payer: Self-pay

## 2022-02-27 ENCOUNTER — Other Ambulatory Visit: Payer: Self-pay

## 2022-02-27 ENCOUNTER — Other Ambulatory Visit: Payer: Self-pay | Admitting: Family

## 2022-02-27 MED ORDER — FREESTYLE LITE TEST VI STRP
ORAL_STRIP | 5 refills | Status: DC
  Filled 2022-02-27: qty 100, 50d supply, fill #0
  Filled 2022-04-29: qty 100, 50d supply, fill #1
  Filled 2022-07-08: qty 100, 50d supply, fill #2
  Filled 2022-09-14: qty 100, 50d supply, fill #3
  Filled 2023-02-11: qty 100, 50d supply, fill #4

## 2022-03-02 ENCOUNTER — Other Ambulatory Visit (HOSPITAL_COMMUNITY): Payer: Self-pay

## 2022-03-02 ENCOUNTER — Other Ambulatory Visit: Payer: Self-pay

## 2022-03-02 MED ORDER — TALTZ 80 MG/ML ~~LOC~~ SOAJ: SUBCUTANEOUS | 10 refills | Status: DC

## 2022-03-03 ENCOUNTER — Ambulatory Visit: Payer: 59 | Attending: Family | Admitting: Pharmacist

## 2022-03-03 ENCOUNTER — Other Ambulatory Visit (HOSPITAL_COMMUNITY): Payer: Self-pay

## 2022-03-03 DIAGNOSIS — Z7189 Other specified counseling: Secondary | ICD-10-CM

## 2022-03-03 MED ORDER — TALTZ 80 MG/ML ~~LOC~~ SOAJ
SUBCUTANEOUS | 10 refills | Status: DC
  Filled 2022-03-03: qty 1, 28d supply, fill #0
  Filled 2022-03-26: qty 1, 28d supply, fill #1
  Filled 2022-04-20: qty 1, 28d supply, fill #2
  Filled 2022-05-21: qty 1, 28d supply, fill #3
  Filled 2022-06-18: qty 1, 28d supply, fill #4
  Filled 2022-07-16: qty 1, 28d supply, fill #5
  Filled 2022-08-13 (×2): qty 1, 28d supply, fill #6
  Filled 2022-09-15: qty 1, 28d supply, fill #7
  Filled 2022-10-14: qty 1, 28d supply, fill #8

## 2022-03-03 NOTE — Progress Notes (Signed)
   S: Patient presents today for review of their specialty medication.   Patient is currently taking Taltz for psoriasis, psoriatic arthritis. Patient is managed by Dr. Posey Pronto for this. She started [place] in July.   Dosing: Plaque psoriasis: SubQ: 160 mg once, followed by 80 mg at weeks 2, 4, 6, 8, 10, and 12, and then 80 mg every 4 weeks. Psoriatic arthritis: SubQ: 160 mg once, followed by 80 mg every 4 weeks; may administer alone or in combination with conventional disease-modifying antirheumatic drugs (eg, methotrexate). Note: For psoriatic arthritis patients with coexisting moderate to severe plaque psoriasis, use the dosing regimen for plaque psoriasis.  Adherence: confirms  Efficacy: reports that she believes this is working well for her.   Monitoring:  S/sx infection: none Injection site reactions: some itching for a couple of days after her injection but tolerates this.  S/sx of IBD: none S/sx of hypersensitivity reaction: none  Current adverse effects: none   O:     Lab Results  Component Value Date   WBC 4.4 05/09/2021   HGB 14.1 05/09/2021   HCT 39.4 05/09/2021   MCV 86.8 05/09/2021   PLT 226 05/09/2021      Chemistry      Component Value Date/Time   NA 138 12/12/2021 0856   NA 138 12/12/2012 1315   K 3.8 12/12/2021 0856   K 3.6 12/12/2012 1315   CL 104 12/12/2021 0856   CL 102 12/12/2012 1315   CO2 28 12/12/2021 0856   CO2 30 12/12/2012 1315   BUN 13 12/12/2021 0856   BUN 9 12/12/2012 1315   CREATININE 0.55 12/12/2021 0856   CREATININE 0.45 (L) 05/09/2021 1611      Component Value Date/Time   CALCIUM 9.2 12/12/2021 0856   CALCIUM 8.8 12/12/2012 1315   ALKPHOS 70 01/06/2021 1121   AST 14 05/09/2021 1611   ALT 16 05/09/2021 1611   BILITOT 0.5 05/09/2021 1611       A/P: 1. Medication review: patient currently on [place] for psoriasis. Reviewed the medication with the patient, including the following: Donnetta Hail is a medication used to treat ankylosing  spondylitis, plaque psoriasis, and psoriatic arthritis. Subcutaneous: Allow to reach room temperature prior to injection (30 minutes). Do not shake. Inject full amount into the upper arms, thighs or any quadrant of the abdomen; administer each injection at a different anatomic location than a previous injection and avoid areas where the skin is tender, bruised, erythematous, indurated, or affected by psoriasis. Administration in the upper, outer arm may be performed by a caregiver or health care provider. Ixekizumab is intended for use under the guidance and supervision of a physician; may be self-injected by the patient following proper training in SubQ injection technique. Possible adverse effects include neutropenia, antibody development, increased risk of infection, injection site reaction, hypersensitivity reactions, and inflammatory bowel disease. Avoid live vaccinations. No recommendations for any changes at this time.  [name], PharmD, [facility], [place] [phone]

## 2022-03-17 ENCOUNTER — Other Ambulatory Visit: Payer: Self-pay

## 2022-03-17 ENCOUNTER — Encounter: Payer: Self-pay | Admitting: Family

## 2022-03-17 ENCOUNTER — Ambulatory Visit: Payer: 59 | Admitting: Family

## 2022-03-17 VITALS — BP 120/70 | HR 78 | Temp 97.6°F | Ht 60.0 in | Wt 123.3 lb

## 2022-03-17 DIAGNOSIS — D3501 Benign neoplasm of right adrenal gland: Secondary | ICD-10-CM

## 2022-03-17 DIAGNOSIS — E1129 Type 2 diabetes mellitus with other diabetic kidney complication: Secondary | ICD-10-CM

## 2022-03-17 DIAGNOSIS — G6289 Other specified polyneuropathies: Secondary | ICD-10-CM | POA: Diagnosis not present

## 2022-03-17 DIAGNOSIS — R809 Proteinuria, unspecified: Secondary | ICD-10-CM

## 2022-03-17 DIAGNOSIS — E119 Type 2 diabetes mellitus without complications: Secondary | ICD-10-CM | POA: Diagnosis not present

## 2022-03-17 LAB — LIPID PANEL
Cholesterol: 94 mg/dL (ref 0–200)
HDL: 58.9 mg/dL (ref >39.00)
LDL Cholesterol: 31 mg/dL (ref 0–99)
NonHDL: 35.04
Total CHOL/HDL Ratio: 2
Triglycerides: 21 mg/dL (ref 0.0–149.0)
VLDL: 4.2 mg/dL (ref 0.0–40.0)

## 2022-03-17 LAB — POCT GLYCOSYLATED HEMOGLOBIN (HGB A1C): Hemoglobin A1C: 7.7 % — AB (ref 4.0–5.6)

## 2022-03-17 LAB — MICROALBUMIN / CREATININE URINE RATIO
Creatinine,U: 27.3 mg/dL
Microalb Creat Ratio: 2.6 mg/g (ref 0.0–30.0)
Microalb, Ur: <0.7 mg/dL (ref 0.0–1.9)

## 2022-03-17 MED ORDER — SEMAGLUTIDE (1 MG/DOSE) 4 MG/3ML ~~LOC~~ SOPN
1.0000 mg | PEN_INJECTOR | SUBCUTANEOUS | 3 refills | Status: DC
  Filled 2022-03-17: qty 3, 28d supply, fill #0
  Filled 2022-04-29: qty 3, 28d supply, fill #1
  Filled 2022-06-10 – 2022-06-11 (×2): qty 3, 28d supply, fill #2

## 2022-03-17 MED ORDER — METFORMIN HCL ER 500 MG PO TB24: 1000.0000 mg | ORAL_TABLET | Freq: Every day | ORAL | 1 refills | Status: DC

## 2022-03-17 NOTE — Assessment & Plan Note (Signed)
Chronic, stable.  Continue gabapentin 100 mg qhs

## 2022-03-17 NOTE — Assessment & Plan Note (Signed)
Lab Results  Component Value Date   HGBA1C 7.7 (A) 03/17/2022   Elevated from prior.  Patient intolerant to metformin 2000 mg daily and advised her to stay on metformin 1000 mg daily due to GI side effects.  Increase Ozempic to 1 mg.  Pending microalbumin as  patient's been taking losartan 12.5 mg every other to every third day due to dizziness.  will monitor.  Consider Jardiance if  dizziness persists on losartan for renal protection.

## 2022-03-17 NOTE — Progress Notes (Signed)
Subjective:    Patient ID: Rachael Weeks, female    DOB: 03-06-1961, 61 y.o.   MRN: [id number]  CC: Rachael Weeks is a 61 y.o. female who presents today to follow up   HPI: Feels well today.  No new complaints.  Peripheral neuropathy-compliant gabapentin 100 mg nightly which is working well for her   Incidental right adrenal adenoma.  Appointment scheduled endocrine, Dr Kelton Pillar 06/23/22    DM-compliant with Ozempic 0.5 mg, metformin 1000 mg qd.  Contininue losartan 12.5 mg QOD due to history of proteinuria.  She decreased losartan from daily to every other day to sometimes every third day due to dizziness, since resolved   HISTORY:  Past Medical History:  Diagnosis Date   Chicken pox    Diabetes mellitus without complication ([place])    Kidney stones    Wears dentures    Past Surgical History:  Procedure Laterality Date   BREAST BIOPSY Right 1983   benign   BREAST SURGERY     CATARACT EXTRACTION, BILATERAL     COLONOSCOPY WITH PROPOFOL N/A 07/29/2021   Procedure: COLONOSCOPY WITH PROPOFOL;  Surgeon: Lin Landsman, MD;  Location: [facility];  Service: Endoscopy;  Laterality: N/A;   INNER EAR SURGERY     POLYPECTOMY N/A 07/29/2021   Procedure: POLYPECTOMY;  Surgeon: Lin Landsman, MD;  Location: [facility];  Service: Endoscopy;  Laterality: N/A;   Family History  Problem Relation Age of Onset   Hypertension Mother    Cancer Father        Prostate   Hyperlipidemia Father    Stroke Father    Hypertension Father    Diabetes Father    Diabetes Sister    Diabetes Sister    Hyperlipidemia Brother    Stroke Brother    Hypertension Brother    Diabetes Brother    Diabetes Brother    Diabetes Brother    Breast cancer Paternal Aunt 59   Diabetes Paternal Grandfather    Diabetes Son    Colon cancer Neg Hx    Thyroid cancer Neg Hx     Allergies: Patient has no known allergies. Current Outpatient Medications on File Prior to Visit   Medication Sig Dispense Refill   Blood Glucose Monitoring Suppl (FREESTYLE FREEDOM LITE) w/Device KIT 1 each by Does not apply route daily. 1 kit 0   cyanocobalamin (VITAMIN B12) 1000 MCG/ML injection 1000 mcg (1 mL) intramuscular injection in the thigh ( vastus lateralis) once per month. 3 mL 3   folic acid (FOLVITE) 1 MG tablet Take 1 tablet (1 mg total) by mouth once daily 90 tablet 3   gabapentin (NEURONTIN) 100 MG capsule Take 1 capsule (100 mg total) by mouth every evening. 90 capsule 3   glucose blood (FREESTYLE LITE) test strip USED TO CHECK BLOOD GLUCOSE 2 TIMES A DAY. 100 each 5   Ixekizumab (TALTZ) 80 MG/ML SOAJ Inject 37m at week 12 then every 4 weeks as directed 1 mL 10   Lancet Devices (B-D LANCET DEVICE) MISC Inject 1 application into the skin 4 (four) times daily. 100 each 3   Lancets (FREESTYLE) lancets USED TO CHECK BLOOD GLUCOSE LEVELS 2 TIMES A DAY. 100 each 5   losartan (COZAAR) 25 MG tablet Take 0.5 tablets (12.5 mg total) by mouth daily. 90 tablet 1   methotrexate (RHEUMATREX) 2.5 MG tablet Take 8 tablets (20 mg total) by mouth every 7 (seven) days All on the same day 32 tablet  5   moxifloxacin (VIGAMOX) 0.5 % ophthalmic solution 1 gtt QID OU x 7 days 3 mL 0   NEEDLE, DISP, 25 G (BD ECLIPSE NEEDLE) 25G X 1" MISC Used to give monthly B-12 injections. 50 each 1   prednisoLONE acetate (PRED FORTE) 1 % ophthalmic suspension Apply 1 drop into left eye four times a day as directed. Shake well before each use! 5 mL 1   SYRINGE/NEEDLE, DISP, 1 ML (BD LUER-LOK SYRINGE) 25G X 5/8" 1 ML MISC Used to give monthly B-12 injections. 50 each 1   tobramycin (TOBREX) 0.3 % ophthalmic solution Instill 1 drop in both eyes four times a day; Begin one day prior to procedure, use day of procedure, one day after then stop 5 mL 5   tobramycin (TOBREX) 0.3 % ophthalmic solution Instill 1 drop into both eyes four times a day; Begin one day prior to procedure, use day of procedure, one day after then  stop 5 mL 2   No current facility-administered medications on file prior to visit.    Social History   Tobacco Use   Smoking status: Former    Packs/day: 1.00    Years: 15.00    Total pack years: 15.00    Types: Cigarettes    Quit date: 09/08/2008    Years since quitting: 13.5   Smokeless tobacco: Never  Vaping Use   Vaping Use: Never used  Substance Use Topics   Alcohol use: No   Drug use: No    Review of Systems  Constitutional:  Negative for chills and fever.  Respiratory:  Negative for cough.   Cardiovascular:  Negative for chest pain and palpitations.  Gastrointestinal:  Negative for nausea and vomiting.  Neurological:  Positive for numbness.      Objective:    BP 120/70 (BP Location: Left Arm, Patient Position: Sitting, Cuff Size: Normal)   Pulse 78   Temp 97.6 F (36.4 C) (Oral)   Ht 5' (1.524 m)   Wt 123 lb 4.8 oz (55.9 kg)   SpO2 99%   BMI 24.08 kg/m  BP Readings from Last 3 Encounters:  03/17/22 120/70  12/12/21 124/72  12/05/21 100/60   Wt Readings from Last 3 Encounters:  03/17/22 123 lb 4.8 oz (55.9 kg)  12/12/21 121 lb 12.8 oz (55.2 kg)  12/05/21 120 lb 8 oz (54.7 kg)    Physical Exam Vitals reviewed.  Constitutional:      Appearance: She is well-developed.  Eyes:     Conjunctiva/sclera: Conjunctivae normal.  Cardiovascular:     Rate and Rhythm: Normal rate and regular rhythm.     Pulses: Normal pulses.     Heart sounds: Normal heart sounds.  Pulmonary:     Effort: Pulmonary effort is normal.     Breath sounds: Normal breath sounds. No wheezing, rhonchi or rales.  Skin:    General: Skin is warm and dry.  Neurological:     Mental Status: She is alert.  Psychiatric:        Speech: Speech normal.        Behavior: Behavior normal.        Thought Content: Thought content normal.        Assessment & Plan:   Problem List Items Addressed This Visit       Endocrine   Adrenal adenoma, right    She has an appointment with  endocrine as scheduled in January.  Discussed importance of discussing surveillance and hormonal evaluation.  Appreciate Dr Kelton Pillar  advise and plan to follow.       DM (diabetes mellitus), type 2 with renal complications ([place]) - Primary    Lab Results  Component Value Date   HGBA1C 7.7 (A) 03/17/2022  Elevated from prior.  Patient intolerant to metformin 2000 mg daily and advised her to stay on metformin 1000 mg daily due to GI side effects.  Increase Ozempic to 1 mg.  Pending microalbumin as  patient's been taking losartan 12.5 mg every other to every third day due to dizziness.  will monitor.  Consider Jardiance if  dizziness persists on losartan for renal protection.       Relevant Medications   Semaglutide, 1 MG/DOSE, 4 MG/3ML SOPN   metFORMIN (GLUCOPHAGE-XR) 500 MG 24 hr tablet   Other Relevant Orders   POCT HgB A1C (Completed)     Nervous and Auditory   Peripheral neuropathy    Chronic, stable.  Continue gabapentin 100 mg qhs      Other Visit Diagnoses     Diabetes mellitus without complication ([facility])       Relevant Medications   Semaglutide, 1 MG/DOSE, 4 MG/3ML SOPN   metFORMIN (GLUCOPHAGE-XR) 500 MG 24 hr tablet   Other Relevant Orders   Lipid panel   Microalbumin / creatinine urine ratio   [place] Lower Ext Art Bilat        I have discontinued Akeema H. Weatherbee's Ozempic (0.25 or 0.5 MG/DOSE). I have also changed her metFORMIN. Additionally, I am having her start on Semaglutide (1 MG/DOSE). Lastly, I am having her maintain her B-D LANCET DEVICE, FreeStyle Freedom Lite, freestyle, folic acid, prednisoLONE acetate, gabapentin, cyanocobalamin, BD Luer-Lok Syringe, BD Eclipse Needle, methotrexate, moxifloxacin, tobramycin, losartan, tobramycin, FREESTYLE LITE, and Taltz.   Meds ordered this encounter  Medications   Semaglutide, 1 MG/DOSE, 4 MG/3ML SOPN    Sig: Inject 1 mg as directed once a week.    Dispense:  3 mL    Refill:  3    Order Specific Question:   Supervising  Provider    Answer:   Deborra Medina L [[id number]]   metFORMIN (GLUCOPHAGE-XR) 500 MG 24 hr tablet    Sig: Take 2 tablets (1,000 mg total) by mouth daily.    Dispense:  180 tablet    Refill:  1    Order Specific Question:   Supervising Provider    Answer:   Crecencio Mc [[id number]]    Return precautions given.   Risks, benefits, and alternatives of the medications and treatment plan prescribed today were discussed, and patient expressed understanding.   Education regarding symptom management and diagnosis given to patient on AVS.  Continue to follow with Burnard Hawthorne, FNP for routine health maintenance.   Romie Levee Addis and I agreed with plan.   Mable Paris, FNP

## 2022-03-17 NOTE — Assessment & Plan Note (Signed)
She has an appointment with endocrine as scheduled in January.  Discussed importance of discussing surveillance and hormonal evaluation.  Appreciate Dr Kelton Pillar advise and plan to follow.

## 2022-03-17 NOTE — Patient Instructions (Addendum)
As discussed, I ordered an ultrasound arterial of your lower extremities to further investigate any circulation disorder.  Let us know if you dont hear back within a week in regards to an appointment being scheduled.   So that you are aware, if you are [facility] MyChart user , please pay attention to your MyChart messages as you may receive a MyChart message with a phone number to call and schedule this test/appointment own your own from our referral coordinator. This is a new process so I do not want you to miss this message.  If you are not a MyChart user, you will receive a phone call.   Please increase Ozempic from 0.5 mg to 1 mg.  May continue metformin 1000 mg daily.    May continue losartan every other to every third day.  Please let me know if dizziness recurs.

## 2022-03-23 ENCOUNTER — Other Ambulatory Visit: Payer: Self-pay

## 2022-03-26 ENCOUNTER — Other Ambulatory Visit (HOSPITAL_COMMUNITY): Payer: Self-pay

## 2022-03-27 ENCOUNTER — Other Ambulatory Visit (HOSPITAL_COMMUNITY): Payer: Self-pay

## 2022-03-27 DIAGNOSIS — H31091 Other chorioretinal scars, right eye: Secondary | ICD-10-CM | POA: Diagnosis not present

## 2022-03-27 DIAGNOSIS — H43822 Vitreomacular adhesion, left eye: Secondary | ICD-10-CM | POA: Diagnosis not present

## 2022-03-27 DIAGNOSIS — E113513 Type 2 diabetes mellitus with proliferative diabetic retinopathy with macular edema, bilateral: Secondary | ICD-10-CM | POA: Diagnosis not present

## 2022-04-02 ENCOUNTER — Other Ambulatory Visit (HOSPITAL_COMMUNITY): Payer: Self-pay

## 2022-04-14 ENCOUNTER — Other Ambulatory Visit: Payer: Self-pay

## 2022-04-14 MED ORDER — METHOTREXATE SODIUM 2.5 MG PO TABS
ORAL_TABLET | ORAL | 5 refills | Status: DC
  Filled 2022-04-14: qty 32, 28d supply, fill #0
  Filled 2022-05-12: qty 32, 28d supply, fill #1
  Filled 2022-06-10 – 2022-06-11 (×2): qty 32, 28d supply, fill #2
  Filled 2022-07-08: qty 32, 28d supply, fill #3
  Filled 2022-08-11: qty 32, 28d supply, fill #4
  Filled 2022-09-14: qty 32, 28d supply, fill #5

## 2022-04-20 ENCOUNTER — Other Ambulatory Visit (HOSPITAL_COMMUNITY): Payer: Self-pay

## 2022-04-22 ENCOUNTER — Other Ambulatory Visit (HOSPITAL_COMMUNITY): Payer: Self-pay

## 2022-04-25 ENCOUNTER — Ambulatory Visit
Admission: EM | Admit: 2022-04-25 | Discharge: 2022-04-25 | Disposition: A | Discharge status: Home / Self Care | Payer: 59 | Attending: Physician Assistant | Admitting: Physician Assistant

## 2022-04-25 ENCOUNTER — Encounter: Payer: Self-pay | Admitting: Emergency Medicine

## 2022-04-25 DIAGNOSIS — U071 COVID-19: Secondary | ICD-10-CM | POA: Diagnosis not present

## 2022-04-25 LAB — RESP PANEL BY RT-PCR (FLU A&B, COVID) ARPGX2
Influenza A by PCR: NEGATIVE
Influenza B by PCR: NEGATIVE
SARS Coronavirus 2 by RT PCR: POSITIVE — AB

## 2022-04-25 LAB — GROUP A STREP BY PCR: Group A Strep by PCR: NOT DETECTED

## 2022-04-25 MED ORDER — MOLNUPIRAVIR EUA 200MG CAPSULE: 4.0000 | ORAL_CAPSULE | Freq: Two times a day (BID) | ORAL | 0 refills | Status: AC

## 2022-04-25 MED ORDER — LIDOCAINE VISCOUS HCL 2 % MT SOLN: 15.0000 mL | OROMUCOSAL | 0 refills | Status: DC | PRN

## 2022-04-25 NOTE — ED Triage Notes (Signed)
Patient c/o sore throat, cough, and runny nose that started later in the day on Thursday.  Patient denies fevers.

## 2022-04-25 NOTE — ED Provider Notes (Signed)
[facility]    CSN: [id number] Arrival date & time: 04/25/22  1445      History   Chief Complaint Chief Complaint  Patient presents with   Sore Throat   Nasal Congestion   Cough    HPI Rachael Weeks is a 60 y.o. female.   Patient presents with nasal congestion, rhinorrhea, bilateral ear fullness, sore throat, nonproductive cough and intermittent generalized headaches beginning 2 days ago.  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  Tolerating food and liquids.  Has attempted use of warm tea which has been minimally effective.  History of diabetes mellitus.  Denies shortness of breath or wheezing.  Past Medical History:  Diagnosis Date   Chicken pox    Diabetes mellitus without complication (HCC)    Kidney stones    Wears dentures     Patient Active Problem List   Diagnosis Date Noted   Adrenal nodule (HCC) 10/14/2021   Atherosclerosis of aorta (HCC) 10/14/2021   Adrenal adenoma, right 10/10/2021   Colon cancer screening    Polyp of sigmoid colon    B12 deficiency 07/11/2021   Left ear pain 07/11/2021   Peripheral neuropathy 05/09/2021   Chronic pain of right thumb 01/06/2021   Severe non-proliferative diabetic retinopathy (HCC) 12/17/2020   History of smoking 12/29/2016   Shortness of breath 12/29/2016   Chest pain 12/17/2016   Psoriatic arthritis (HCC) 12/17/2016   Mass 12/17/2016   DM (diabetes mellitus), type 2 with renal complications (HCC) 09/17/2016   Routine physical examination 09/17/2016    Past Surgical History:  Procedure Laterality Date   BREAST BIOPSY Right 1983   benign   BREAST SURGERY     CATARACT EXTRACTION, BILATERAL     COLONOSCOPY WITH PROPOFOL N/A 07/29/2021   Procedure: COLONOSCOPY WITH PROPOFOL;  Surgeon: Vanga, Rohini Reddy, MD;  Location: [facility];  Service: Endoscopy;  Laterality: N/A;   INNER EAR SURGERY     POLYPECTOMY N/A 07/29/2021   Procedure: POLYPECTOMY;  Surgeon: Vanga, Rohini Reddy,  MD;  Location: [facility];  Service: Endoscopy;  Laterality: N/A;    OB History   No obstetric history on file.      Home Medications    Prior to Admission medications   Medication Sig Start Date End Date Taking? Authorizing Provider  cyanocobalamin (VITAMIN B12) 1000 MCG/ML injection 1000 mcg (1 mL) intramuscular injection in the thigh ( vastus lateralis) once per month. 07/11/21  Yes Arnett, Margaret G, FNP  folic acid (FOLVITE) 1 MG tablet Take 1 tablet (1 mg total) by mouth once daily 03/10/21  Yes   gabapentin (NEURONTIN) 100 MG capsule Take 1 capsule (100 mg total) by mouth every evening. 05/09/21  Yes Arnett, Margaret G, FNP  Ixekizumab (TALTZ) 80 MG/ML SOAJ Inject 80mg at week 12 then every 4 weeks as directed 03/03/22  Yes Jegede, Olugbemiga E, MD  metFORMIN (GLUCOPHAGE-XR) 500 MG 24 hr tablet Take 2 tablets (1,000 mg total) by mouth daily. 03/17/22  Yes Arnett, Margaret G, FNP  methotrexate (RHEUMATREX) 2.5 MG tablet Take 8 tablets (20 mg total) by mouth every 7 (seven) days All on the same day 04/14/22  Yes   Semaglutide, 1 MG/DOSE, 4 MG/3ML SOPN Inject 1 mg into the skin once a week. 03/17/22  Yes Arnett, Margaret G, FNP  Blood Glucose Monitoring Suppl (FREESTYLE FREEDOM LITE) w/Device KIT 1 each by Does not apply route daily. 01/07/21   Arnett, Margaret G, FNP  glucose blood (  FREESTYLE LITE) test strip USED TO CHECK BLOOD GLUCOSE 2 TIMES A DAY. 02/27/22   Arnett, Margaret G, FNP  Lancet Devices (B-D LANCET DEVICE) MISC Inject 1 application into the skin 4 (four) times daily. 09/24/16   Arnett, Margaret G, FNP  Lancets (FREESTYLE) lancets USED TO CHECK BLOOD GLUCOSE LEVELS 2 TIMES A DAY. 01/07/21   Arnett, Margaret G, FNP  losartan (COZAAR) 25 MG tablet Take 0.5 tablets (12.5 mg total) by mouth daily. 02/03/22   Webb, Padonda B, FNP  moxifloxacin (VIGAMOX) 0.5 % ophthalmic solution 1 gtt QID OU x 7 days 12/18/21     NEEDLE, DISP, 25 G (BD ECLIPSE NEEDLE) 25G X 1" MISC Used to give  monthly B-12 injections. 07/11/21   Arnett, Margaret G, FNP  prednisoLONE acetate (PRED FORTE) 1 % ophthalmic suspension Apply 1 drop into left eye four times a day as directed. Shake well before each use! 03/18/21     SYRINGE/NEEDLE, DISP, 1 ML (BD LUER-LOK SYRINGE) 25G X 5/8" 1 ML MISC Used to give monthly B-12 injections. 07/11/21   Arnett, Margaret G, FNP  tobramycin (TOBREX) 0.3 % ophthalmic solution Instill 1 drop in both eyes four times a day; Begin one day prior to procedure, use day of procedure, one day after then stop 01/22/22     tobramycin (TOBREX) 0.3 % ophthalmic solution Instill 1 drop into both eyes four times a day; Begin one day prior to procedure, use day of procedure, one day after then stop 02/25/22       Family History Family History  Problem Relation Age of Onset   Hypertension Mother    Cancer Father        Prostate   Hyperlipidemia Father    Stroke Father    Hypertension Father    Diabetes Father    Diabetes Sister    Diabetes Sister    Hyperlipidemia Brother    Stroke Brother    Hypertension Brother    Diabetes Brother    Diabetes Brother    Diabetes Brother    Breast cancer Paternal Aunt 40   Diabetes Paternal Grandfather    Diabetes Son    Colon cancer Neg Hx    Thyroid cancer Neg Hx     Social History Social History   Tobacco Use   Smoking status: Former    Packs/day: 1.00    Years: 15.00    Total pack years: 15.00    Types: Cigarettes    Quit date: 09/08/2008    Years since quitting: 13.6   Smokeless tobacco: Never  Vaping Use   Vaping Use: Never used  Substance Use Topics   Alcohol use: No   Drug use: No     Allergies   Patient has no known allergies.   Review of Systems Review of Systems  Respiratory:  Positive for cough.      Physical Exam Triage Vital Signs ED Triage Vitals  Enc Vitals Group     BP 04/25/22 1502 138/61     Pulse Rate 04/25/22 1502 79     Resp 04/25/22 1502 14     Temp 04/25/22 1502 98.2 F (36.8 C)      Temp Source 04/25/22 1502 Oral     SpO2 04/25/22 1502 100 %     Weight 04/25/22 1459 123 lb 3.8 oz (55.9 kg)     Height 04/25/22 1459 5' (1.524 m)     Head Circumference --      Peak Flow --        Pain Score 04/25/22 1459 4     Pain Loc --      Pain Edu? --      Excl. in [place]? --    No data found.  Updated Vital Signs BP 138/61 (BP Location: Left Arm)   Pulse 79   Temp 98.2 F (36.8 C) (Oral)   Resp 14   Ht 5' (1.524 m)   Wt 123 lb 3.8 oz (55.9 kg)   SpO2 100%   BMI 24.07 kg/m   Visual Acuity Right Eye Distance:   Left Eye Distance:   Bilateral Distance:    Right Eye Near:   Left Eye Near:    Bilateral Near:     Physical Exam Constitutional:      Appearance: Normal appearance. She is well-developed.  HENT:     Head: Normocephalic.     Right Ear: Tympanic membrane and ear canal normal.     Left Ear: Tympanic membrane normal.     Nose: Congestion and rhinorrhea present.     Mouth/Throat:     Pharynx: No posterior oropharyngeal erythema.     Tonsils: No tonsillar exudate. 0 on the right. 0 on the left.  Eyes:     Extraocular Movements: Extraocular movements intact.  Cardiovascular:     Rate and Rhythm: Normal rate and regular rhythm.     Pulses: Normal pulses.     Heart sounds: Normal heart sounds.  Pulmonary:     Effort: Pulmonary effort is normal.     Breath sounds: Normal breath sounds.  Musculoskeletal:     Cervical back: Normal range of motion.  Lymphadenopathy:     Cervical: Cervical adenopathy present.  Skin:    General: Skin is warm and dry.  Neurological:     Mental Status: She is alert and oriented to person, place, and time. Mental status is at baseline.  Psychiatric:        Mood and Affect: Mood normal.        Behavior: Behavior normal.      UC Treatments / Results  Labs (all labs ordered are listed, but only abnormal results are displayed) Labs Reviewed  GROUP A STREP BY PCR  RESP PANEL BY RT-PCR (FLU A&B, COVID) [id number]     EKG   Radiology No results found.  Procedures Procedures (including critical care time)  Medications Ordered in UC Medications - No data to display  Initial Impression / Assessment and Plan / UC Course  I have reviewed the triage vital signs and the nursing notes.  Pertinent labs & imaging results that were available during my care of the patient were reviewed by me and considered in my medical decision making (see chart for details).  COVID-19  Confirmed by PCR, negative for flu and strep, discussed with patient, due to history of diabetes qualifies for antiviral treatment, sent to pharmacy as well as viscous lidocaine and sore throat is most worrisome symptom, discussed administration, discussed quarantine guidelines per current CDC recommendations, may use additional over-the-counter medications as needed for supportive care with follow-up with urgent care if symptoms persist or worsen Final Clinical Impressions(s) / UC Diagnoses   Final diagnoses:  None   Discharge Instructions   None    ED Prescriptions   None    PDMP not reviewed this encounter.   Hans Eden, NP 04/25/22 1559

## 2022-04-25 NOTE — Discharge Instructions (Addendum)
COVID test is positive, flu and  strep negative  We will need to quarantine until Tuesday, if still having symptoms please wear mask for additional 5 days  Your history of diabetes you will receive antiviral treatment, antivirals help to reduce the amount of virus in the body and keep virus from replicating, until as this will help to minimize your symptoms and the timeline that you are sick, take every morning and every evening for 5 days  You may gargle and spit lidocaine solution every 4 hours to help temporarily numb the throat    You can take Tylenol and/or Ibuprofen as needed for fever reduction and pain relief.   For cough: honey 1/2 to 1 teaspoon (you can dilute the honey in water or another fluid).  You can also use guaifenesin and dextromethorphan for cough. You can use a humidifier for chest congestion and cough.  If you don't have a humidifier, you can sit in the bathroom with the hot shower running.      For sore throat: try warm salt water gargles, cepacol lozenges, throat spray, warm tea or water with lemon/honey, popsicles or ice, or OTC cold relief medicine for throat discomfort.   For congestion: take a daily anti-histamine like Zyrtec, Claritin, and a oral decongestant, such as pseudoephedrine.  You can also use Flonase 1-2 sprays in each nostril daily.   It is important to stay hydrated: drink plenty of fluids (water, gatorade/powerade/pedialyte, juices, or teas) to keep your throat moisturized and help further relieve irritation/discomfort.

## 2022-04-27 ENCOUNTER — Other Ambulatory Visit (HOSPITAL_COMMUNITY): Payer: Self-pay

## 2022-04-29 ENCOUNTER — Other Ambulatory Visit: Payer: Self-pay

## 2022-05-08 DIAGNOSIS — E113513 Type 2 diabetes mellitus with proliferative diabetic retinopathy with macular edema, bilateral: Secondary | ICD-10-CM | POA: Diagnosis not present

## 2022-05-08 DIAGNOSIS — H43823 Vitreomacular adhesion, bilateral: Secondary | ICD-10-CM | POA: Diagnosis not present

## 2022-05-08 DIAGNOSIS — H31001 Unspecified chorioretinal scars, right eye: Secondary | ICD-10-CM | POA: Diagnosis not present

## 2022-05-12 ENCOUNTER — Other Ambulatory Visit: Payer: Self-pay

## 2022-05-19 ENCOUNTER — Other Ambulatory Visit (HOSPITAL_COMMUNITY): Payer: Self-pay

## 2022-05-21 ENCOUNTER — Other Ambulatory Visit: Payer: Self-pay

## 2022-05-21 ENCOUNTER — Other Ambulatory Visit (HOSPITAL_COMMUNITY): Payer: Self-pay

## 2022-06-11 ENCOUNTER — Other Ambulatory Visit: Payer: Self-pay

## 2022-06-15 ENCOUNTER — Other Ambulatory Visit (HOSPITAL_COMMUNITY): Payer: Self-pay

## 2022-06-17 ENCOUNTER — Ambulatory Visit: Payer: 59 | Admitting: Family

## 2022-06-17 DIAGNOSIS — E113513 Type 2 diabetes mellitus with proliferative diabetic retinopathy with macular edema, bilateral: Secondary | ICD-10-CM | POA: Diagnosis not present

## 2022-06-17 DIAGNOSIS — H43823 Vitreomacular adhesion, bilateral: Secondary | ICD-10-CM | POA: Diagnosis not present

## 2022-06-17 DIAGNOSIS — H31001 Unspecified chorioretinal scars, right eye: Secondary | ICD-10-CM | POA: Diagnosis not present

## 2022-06-18 ENCOUNTER — Other Ambulatory Visit (HOSPITAL_COMMUNITY): Payer: Self-pay

## 2022-06-18 DIAGNOSIS — Z7960 Long term (current) use of unspecified immunomodulators and immunosuppressants: Secondary | ICD-10-CM | POA: Diagnosis not present

## 2022-06-18 DIAGNOSIS — L400 Psoriasis vulgaris: Secondary | ICD-10-CM | POA: Diagnosis not present

## 2022-06-18 DIAGNOSIS — L4050 Arthropathic psoriasis, unspecified: Secondary | ICD-10-CM | POA: Diagnosis not present

## 2022-06-22 ENCOUNTER — Other Ambulatory Visit (HOSPITAL_COMMUNITY): Payer: Self-pay

## 2022-06-22 NOTE — Progress Notes (Signed)
Name: Rachael Weeks  MRN/ DOB: [id number], [birth year]    Age/ Sex: 62 y.o., female    PCP: Burnard Hawthorne, FNP   Reason for Endocrinology Evaluation: Right adrenal adenoma     Date of Initial Endocrinology Evaluation: 06/23/2022     HPI: [name] is a 62 y.o. female with a past medical history of DM, psoriatic arthritis . The patient presented for initial endocrinology clinic visit on 06/23/2022 for consultative assistance with her right adrenal adenoma.   Patient was noted with a right adrenal adenoma 2.2 cm on CT imaging for lung cancer screening on 09/23/2021   Adrenal incidentaloma: Substantial weight gain-no Easy bruisability- occasional  Severe hypertension-no DM- yes Proximal muscle weakness-yes Sudden/ severe headaches-no Weight loss-no Anxiety attacks-no Cardiac arrhythmias-no Palpitations-no Fluid retention-no  Hypokalemia- no   She has psoriatic arthritis , on MTX but no steroids    She is a lab tech  She lives in [place]:  Past Medical History:  Past Medical History:  Diagnosis Date   Chicken pox    Diabetes mellitus without complication ([place])    Kidney stones    Wears dentures    Past Surgical History:  Past Surgical History:  Procedure Laterality Date   BREAST BIOPSY Right 1983   benign   BREAST SURGERY     CATARACT EXTRACTION, BILATERAL     COLONOSCOPY WITH PROPOFOL N/A 07/29/2021   Procedure: COLONOSCOPY WITH PROPOFOL;  Surgeon: Lin Landsman, MD;  Location: [facility];  Service: Endoscopy;  Laterality: N/A;   INNER EAR SURGERY     POLYPECTOMY N/A 07/29/2021   Procedure: POLYPECTOMY;  Surgeon: Lin Landsman, MD;  Location: [facility];  Service: Endoscopy;  Laterality: N/A;    Social History:  reports that she quit smoking about 13 years ago. Her smoking use included cigarettes. She has a 15.00 pack-year smoking history. She has never used smokeless tobacco. She reports that she does not  drink alcohol and does not use drugs. Family History: family history includes Breast cancer (age of onset: 35) in her paternal aunt; Cancer in her father; Diabetes in her brother, brother, brother, father, paternal grandfather, sister, sister, and son; Hyperlipidemia in her brother and father; Hypertension in her brother, father, and mother; Stroke in her brother and father.   HOME MEDICATIONS: Allergies as of 06/23/2022   No Known Allergies      Medication List        Accurate as of June 23, 2022 11:11 AM. If you have any questions, ask your nurse or doctor.          B-D LANCET DEVICE Misc Inject 1 application into the skin 4 (four) times daily.   BD Eclipse Needle 25G X 1" Misc Generic drug: NEEDLE (DISP) 25 G Used to give monthly B-12 injections.   BD Luer-Lok Syringe 25G X 5/8" 1 ML Misc Generic drug: SYRINGE/NEEDLE (DISP) 1 ML Used to give monthly B-12 injections.   cyanocobalamin 1000 MCG/ML injection Commonly known as: VITAMIN B12 Inject 1 mL into the thigh muscle once per month (1000 mcg (1 mL) intramuscular injection in the thigh ( vastus lateralis) once per month.)   folic acid 1 MG tablet Commonly known as: FOLVITE Take 1 tablet (1 mg total) by mouth once daily   FreeStyle Freedom Lite w/Device Kit 1 each by Does not apply route daily.   freestyle lancets USED TO CHECK BLOOD GLUCOSE LEVELS 2 TIMES A DAY.  FREESTYLE LITE test strip Generic drug: glucose blood USED TO CHECK BLOOD GLUCOSE 2 TIMES A DAY.   gabapentin 100 MG capsule Commonly known as: NEURONTIN Take 1 capsule (100 mg total) by mouth every evening.   lidocaine 2 % solution Commonly known as: XYLOCAINE Use as directed 15 mLs in the mouth or throat every 4 (four) hours as needed.   losartan 25 MG tablet Commonly known as: COZAAR Take 1/2 tablet (12.'5mg'$ ) by mouth daily (Take 0.5 tablets (12.5 mg total) by mouth daily.)   metFORMIN 500 MG 24 hr tablet Commonly known as:  GLUCOPHAGE-XR Take 2 tablets (1,000 mg total) by mouth daily.   methotrexate 2.5 MG tablet Commonly known as: RHEUMATREX Take 8 tablets (20 mg total) by mouth every 7 (seven) days All on the same day   moxifloxacin 0.5 % ophthalmic solution Commonly known as: VIGAMOX Place one drop in both eyes 4 times a day for 7 days (1 gtt QID OU x 7 days)   Ozempic (1 MG/DOSE) 4 MG/3ML Sopn Generic drug: Semaglutide (1 MG/DOSE) Inject 1 mg into the skin once a week.   prednisoLONE acetate 1 % ophthalmic suspension Commonly known as: PRED FORTE Apply 1 drop into left eye four times a day as directed. Shake well before each use!   Taltz 80 MG/ML Soaj Generic drug: Ixekizumab Inject '80mg'$  at week 12 then every 4 weeks as directed   tobramycin 0.3 % ophthalmic solution Commonly known as: TOBREX Instill 1 drop in both eyes four times a day; Begin one day prior to procedure, use day of procedure, one day after then stop   tobramycin 0.3 % ophthalmic solution Commonly known as: TOBREX Instill 1 drop into both eyes four times a day; Begin one day prior to procedure, use day of procedure, one day after then stop          REVIEW OF SYSTEMS: A comprehensive ROS was conducted with the patient and is negative except as per HPI     OBJECTIVE:  VS: BP (!) 144/76 (BP Location: Left Arm, Patient Position: Sitting, Cuff Size: Normal)   Pulse 78   Ht 5' (1.524 m)   Wt 124 lb 6.4 oz (56.4 kg)   SpO2 99%   BMI 24.30 kg/m    Wt Readings from Last 3 Encounters:  04/25/22 123 lb 3.8 oz (55.9 kg)  03/17/22 123 lb 4.8 oz (55.9 kg)  12/12/21 121 lb 12.8 oz (55.2 kg)     EXAM: General: Pt appears well and is in NAD  Eyes: External eye exam normal without stare, lid lag or exophthalmos.  EOM intact.  PERRL.  Neck: General: Supple without adenopathy. Thyroid: Thyroid size normal.  No goiter or nodules appreciated. No thyroid bruit.  Lungs: Clear with good BS bilat with no rales, rhonchi, or  wheezes  Heart: Auscultation: RRR.  Abdomen: Normoactive bowel sounds, soft, nontender, without masses or organomegaly palpable  Extremities:  BL LE: No pretibial edema normal ROM and strength.  Mental Status: Judgment, insight: Intact Orientation: Oriented to time, place, and person Mood and affect: No depression, anxiety, or agitation     DATA REVIEWED:  Latest Reference Range & Units 06/23/22 11:34  ALDOSTERONE 0.0 - 30.0 ng/dL 1.8  ALDOS/RENIN RATIO 0.0 - 30.0  7.0    Latest Reference Range & Units 06/25/22 03:35  Dopamine 24 Hr Urine 0 - 510 ug/24 hr 128  Dopamine, Rand Ur Undefined ug/L 80  Epinephrine 24 Hr Urine 0 - 20 ug/24 hr <  5  Epinephrine, Rand Ur Undefined ug/L <3  Norepinephrine, Rand Ur Undefined ug/L <15  Norepinephrine, 24H Ur 0 - 135 ug/24 hr <24  VMA, Urine Undefined mg/L 1.8  Creatinine, Urine Not Estab. mg/dL 50.6  Creatinine, 24H Ur 800 - 1,800 mg/24 hr 810  VMA, 24H Ur Adult 0.0 - 7.5 mg/24 hr 2.9    Latest Reference Range & Units 06/25/22 03:35  Cortisol (Ur), Free 6 - 42 ug/24 hr 34  Cortisol,F,ug/L,U Undefined ug/L 21      Latest Reference Range & Units 06/23/22 11:34  Sodium 134 - 144 mmol/L 142  Potassium 3.5 - 5.2 mmol/L 3.8  Chloride 96 - 106 mmol/L 104  CO2 20 - 29 mmol/L 23  Glucose 70 - 99 mg/dL 215 (H)  BUN 8 - 27 mg/dL 9  Creatinine 0.57 - 1.00 mg/dL 0.51 (L)  Calcium 8.7 - 10.3 mg/dL 9.2  BUN/Creatinine Ratio 12 - 28  18  eGFR >59 mL/min/1.73 106    CT chest 09/23/2021 Upper abdomen: Right adrenal 2.2 cm nodule with density 2 HU, compatible with a benign adenoma.  ASSESSMENT/PLAN/RECOMMENDATIONS:   Right Adrenal Adenoma:   - 45 five percent of adrenal adenomas are nonsecretory.  - Three forms of adrenal hyperfunction should be considered in patients with adrenal incidentaloma  Glucocorticoid hypersecretion Primary hyperaldosteronism Pheochromocytoma  -Lab work have shown normal Aldo, renin, and 24-hour urinary  cortisol and catecholamines -She gets annual lung cancer screening and will monitor her adrenal gland through that  Follow-up in 1 year  Signed electronically by: Mack Guise, MD  [facility]  [address] [address] [place], [place] [address] Phone: [phone] FAX: [phone]   CC: Burnard Hawthorne, FNP [address] [place] [address] Phone: [phone] Fax: [phone]   Return to Endocrinology clinic as below: Future Appointments  Date Time Provider [place]  06/24/2022  2:30 PM Arnett, Yvetta Coder, FNP [id number]

## 2022-06-23 ENCOUNTER — Ambulatory Visit: Payer: Commercial Managed Care - PPO | Admitting: Internal Medicine

## 2022-06-23 ENCOUNTER — Other Ambulatory Visit: Payer: Self-pay

## 2022-06-23 ENCOUNTER — Encounter: Payer: Self-pay | Admitting: Internal Medicine

## 2022-06-23 VITALS — BP 144/76 | HR 78 | Ht 60.0 in | Wt 124.4 lb

## 2022-06-23 DIAGNOSIS — R809 Proteinuria, unspecified: Secondary | ICD-10-CM

## 2022-06-23 DIAGNOSIS — E1129 Type 2 diabetes mellitus with other diabetic kidney complication: Secondary | ICD-10-CM

## 2022-06-23 DIAGNOSIS — D3501 Benign neoplasm of right adrenal gland: Secondary | ICD-10-CM

## 2022-06-23 NOTE — Patient Instructions (Signed)

## 2022-06-24 ENCOUNTER — Telehealth: Payer: Self-pay | Admitting: Internal Medicine

## 2022-06-24 ENCOUNTER — Ambulatory Visit: Payer: Commercial Managed Care - PPO | Admitting: Family

## 2022-06-24 DIAGNOSIS — R7309 Other abnormal glucose: Secondary | ICD-10-CM

## 2022-06-25 DIAGNOSIS — D3501 Benign neoplasm of right adrenal gland: Secondary | ICD-10-CM | POA: Diagnosis not present

## 2022-06-29 LAB — CREATININE, URINE, 24 HOUR
Creatinine, 24H Ur: 810 mg/24 hr (ref 800–1800)
Creatinine, Urine: 50.6 mg/dL

## 2022-06-30 LAB — BASIC METABOLIC PANEL
BUN/Creatinine Ratio: 18 (ref 12–28)
BUN: 9 mg/dL (ref 8–27)
CO2: 23 mmol/L (ref 20–29)
Calcium: 9.2 mg/dL (ref 8.7–10.3)
Chloride: 104 mmol/L (ref 96–106)
Creatinine, Ser: 0.51 mg/dL — ABNORMAL LOW (ref 0.57–1.00)
Glucose: 215 mg/dL — ABNORMAL HIGH (ref 70–99)
Potassium: 3.8 mmol/L (ref 3.5–5.2)
Sodium: 142 mmol/L (ref 134–144)
eGFR: 106 mL/min/{1.73_m2} (ref >59)

## 2022-06-30 LAB — ALDOSTERONE + RENIN ACTIVITY W/ RATIO
Aldos/Renin Ratio: 7 (ref 0.0–30.0)
Aldosterone: 1.8 ng/dL (ref 0.0–30.0)
Renin Activity, Plasma: 0.258 ng/mL/hr (ref 0.167–5.380)

## 2022-07-01 ENCOUNTER — Other Ambulatory Visit (HOSPITAL_COMMUNITY): Payer: Self-pay

## 2022-07-01 LAB — CATECHOLAMINE+VMA, 24-HR URINE
Dopamine , 24H Ur: 128 ug/24 hr (ref 0–510)
Dopamine, Rand Ur: 80 ug/L
Epinephrine, 24H Ur: <5 ug/24 hr (ref 0–20)
Epinephrine, Rand Ur: <3 ug/L
Norepinephrine, 24H Ur: <24 ug/24 hr (ref 0–135)
Norepinephrine, Rand Ur: <15 ug/L
VMA, 24H Ur Adult: 2.9 mg/24 hr (ref 0.0–7.5)
VMA, Urine: 1.8 mg/L

## 2022-07-01 LAB — CORTISOL, URINE, FREE
Cortisol (Ur), Free: 34 ug/24 hr (ref 6–42)
Cortisol,F,ug/L,U: 21 ug/L

## 2022-07-01 NOTE — Progress Notes (Signed)
Order(s) created erroneously. Erroneous order ID: [id number]  Order moved by: Brigitte Pulse  Order move date/time: 07/01/2022 2:44 PM  Source Patient: [id number]  Source Contact: 06/23/2022  Destination Patient: [id number]  Destination Contact: 06/18/2020  Erroneous order ID: [id number]  Order moved by: Brigitte Pulse  Order move date/time: 07/01/2022 2:44 PM  Source Patient: [id number]  Source Contact: 06/23/2022  Destination Patient: [id number]  Destination Contact: 06/18/2020

## 2022-07-02 NOTE — Telephone Encounter (Signed)
error 

## 2022-07-03 ENCOUNTER — Encounter: Payer: Self-pay | Admitting: Internal Medicine

## 2022-07-08 ENCOUNTER — Ambulatory Visit: Payer: Commercial Managed Care - PPO | Admitting: Family

## 2022-07-08 ENCOUNTER — Other Ambulatory Visit: Payer: Self-pay

## 2022-07-08 ENCOUNTER — Encounter: Payer: Self-pay | Admitting: Family

## 2022-07-08 VITALS — BP 136/78 | HR 87 | Temp 97.6°F | Ht 60.0 in | Wt 118.4 lb

## 2022-07-08 DIAGNOSIS — D3501 Benign neoplasm of right adrenal gland: Secondary | ICD-10-CM | POA: Diagnosis not present

## 2022-07-08 DIAGNOSIS — R7309 Other abnormal glucose: Secondary | ICD-10-CM

## 2022-07-08 DIAGNOSIS — E119 Type 2 diabetes mellitus without complications: Secondary | ICD-10-CM

## 2022-07-08 DIAGNOSIS — E1129 Type 2 diabetes mellitus with other diabetic kidney complication: Secondary | ICD-10-CM | POA: Diagnosis not present

## 2022-07-08 DIAGNOSIS — R809 Proteinuria, unspecified: Secondary | ICD-10-CM

## 2022-07-08 LAB — POCT GLYCOSYLATED HEMOGLOBIN (HGB A1C): Hemoglobin A1C: 7.2 % — AB (ref 4.0–5.6)

## 2022-07-08 MED ORDER — SEMAGLUTIDE (2 MG/DOSE) 8 MG/3ML ~~LOC~~ SOPN
2.0000 mg | PEN_INJECTOR | SUBCUTANEOUS | 3 refills | Status: DC
  Filled 2022-07-08: qty 3, 28d supply, fill #0
  Filled 2022-08-11: qty 3, 28d supply, fill #1
  Filled 2022-09-14: qty 3, 28d supply, fill #2
  Filled 2022-10-23: qty 3, 28d supply, fill #3

## 2022-07-08 NOTE — Patient Instructions (Signed)
Stop metformin.  Increase Ozempic to 2 mg.

## 2022-07-08 NOTE — Assessment & Plan Note (Signed)
Lab Results  Component Value Date   HGBA1C 7.2 (A) 07/08/2022   Improved.  Patient intolerant to metformin and advised to go ahead and discontinue completely.  Will increase Ozempic to 2 mg.  Patient has not been taking losartan 12.'5mg'$  qod.  Pending microalbumin normal, will continue to monitor without use of ARB for renal protection per her preference.

## 2022-07-08 NOTE — Progress Notes (Signed)
Assessment & Plan:  Diabetes mellitus without complication (HCC) -     Semaglutide (2 MG/DOSE); Inject 2 mg as directed once a week.  Dispense: 3 mL; Refill: 3 -     Microalbumin / creatinine urine ratio  Adrenal adenoma, right  Elevated glucose -     POCT glycosylated hemoglobin (Hb A1C)  Type 2 diabetes mellitus with microalbuminuria, without long-term current use of insulin (HCC) Assessment & Plan: Lab Results  Component Value Date   HGBA1C 7.2 (A) 07/08/2022   Improved.  Patient intolerant to metformin and advised to go ahead and discontinue completely.  Will increase Ozempic to 2 mg.  Patient has not been taking losartan 12.'5mg'$  qod.  Pending microalbumin normal, will continue to monitor without use of ARB for renal protection per her preference.       Return precautions given.   Risks, benefits, and alternatives of the medications and treatment plan prescribed today were discussed, and patient expressed understanding.   Education regarding symptom management and diagnosis given to patient on AVS either electronically or printed.  Return in about 3 months (around 10/06/2022) for Complete Physical Exam.  Mable Paris, FNP  Subjective:    Patient ID: Rachael Weeks, female    DOB: 11/17/1960, 62 y.o.   MRN: [id number]  CC: Rachael Weeks is a 62 y.o. female who presents today for follow up.   HPI: Feels well today No new complaints DM-  She can be forgetful with metformin, and will take  '500mg'$  daily due to GI side effects, Ozempic to 1 mg. She is not taking losartan 12.5 mg every other to every third day due to dizziness and blood pressures have been well controlled.       Allergies: Patient has no known allergies. Current Outpatient Medications on File Prior to Visit  Medication Sig Dispense Refill   Blood Glucose Monitoring Suppl (FREESTYLE FREEDOM LITE) w/Device KIT 1 each by Does not apply route daily. 1 kit 0   cyanocobalamin (VITAMIN B12) 1000 MCG/ML  injection 1000 mcg (1 mL) intramuscular injection in the thigh ( vastus lateralis) once per month. 3 mL 3   folic acid (FOLVITE) 1 MG tablet Take 1 tablet (1 mg total) by mouth once daily 90 tablet 3   gabapentin (NEURONTIN) 100 MG capsule Take 1 capsule (100 mg total) by mouth every evening. 90 capsule 3   glucose blood (FREESTYLE LITE) test strip USED TO CHECK BLOOD GLUCOSE 2 TIMES A DAY. 100 each 5   Ixekizumab (TALTZ) 80 MG/ML SOAJ Inject '80mg'$  at week 12 then every 4 weeks as directed 1 mL 10   Lancet Devices (B-D LANCET DEVICE) MISC Inject 1 application into the skin 4 (four) times daily. 100 each 3   Lancets (FREESTYLE) lancets USED TO CHECK BLOOD GLUCOSE LEVELS 2 TIMES A DAY. 100 each 5   methotrexate (RHEUMATREX) 2.5 MG tablet Take 8 tablets (20 mg total) by mouth every 7 (seven) days All on the same day 32 tablet 5   moxifloxacin (VIGAMOX) 0.5 % ophthalmic solution 1 gtt QID OU x 7 days 3 mL 0   NEEDLE, DISP, 25 G (BD ECLIPSE NEEDLE) 25G X 1" MISC Used to give monthly B-12 injections. 50 each 1   prednisoLONE acetate (PRED FORTE) 1 % ophthalmic suspension Apply 1 drop into left eye four times a day as directed. Shake well before each use! 5 mL 1   SYRINGE/NEEDLE, DISP, 1 ML (BD LUER-LOK SYRINGE) 25G X 5/8" 1 ML MISC  Used to give monthly B-12 injections. 50 each 1   tobramycin (TOBREX) 0.3 % ophthalmic solution Instill 1 drop in both eyes four times a day; Begin one day prior to procedure, use day of procedure, one day after then stop 5 mL 5   tobramycin (TOBREX) 0.3 % ophthalmic solution Instill 1 drop into both eyes four times a day; Begin one day prior to procedure, use day of procedure, one day after then stop 5 mL 2   No current facility-administered medications on file prior to visit.    Review of Systems  Constitutional:  Negative for chills and fever.  Respiratory:  Negative for cough.   Cardiovascular:  Negative for chest pain and palpitations.  Gastrointestinal:  Negative for  nausea and vomiting.      Objective:    BP 136/78   Pulse 87   Temp 97.6 F (36.4 C) (Oral)   Ht 5' (1.524 m)   Wt 118 lb 6.4 oz (53.7 kg)   SpO2 98%   BMI 23.12 kg/m  BP Readings from Last 3 Encounters:  07/08/22 136/78  06/23/22 (!) 144/76  04/25/22 138/61   Wt Readings from Last 3 Encounters:  07/08/22 118 lb 6.4 oz (53.7 kg)  06/23/22 124 lb 6.4 oz (56.4 kg)  04/25/22 123 lb 3.8 oz (55.9 kg)    Physical Exam Vitals reviewed.  Constitutional:      Appearance: She is well-developed.  Eyes:     Conjunctiva/sclera: Conjunctivae normal.  Cardiovascular:     Rate and Rhythm: Normal rate and regular rhythm.     Pulses: Normal pulses.     Heart sounds: Normal heart sounds.  Pulmonary:     Effort: Pulmonary effort is normal.     Breath sounds: Normal breath sounds. No wheezing, rhonchi or rales.  Skin:    General: Skin is warm and dry.  Neurological:     Mental Status: She is alert.  Psychiatric:        Speech: Speech normal.        Behavior: Behavior normal.        Thought Content: Thought content normal.

## 2022-07-09 LAB — MICROALBUMIN / CREATININE URINE RATIO
Creatinine,U: 110.1 mg/dL
Microalb Creat Ratio: 0.8 mg/g (ref 0.0–30.0)
Microalb, Ur: 0.9 mg/dL (ref 0.0–1.9)

## 2022-07-10 ENCOUNTER — Other Ambulatory Visit: Payer: Self-pay

## 2022-07-16 ENCOUNTER — Other Ambulatory Visit (HOSPITAL_COMMUNITY): Payer: Self-pay

## 2022-07-20 ENCOUNTER — Other Ambulatory Visit: Payer: Self-pay

## 2022-07-21 ENCOUNTER — Other Ambulatory Visit: Payer: Self-pay

## 2022-07-26 ENCOUNTER — Other Ambulatory Visit: Payer: Self-pay | Admitting: Acute Care

## 2022-07-26 DIAGNOSIS — Z122 Encounter for screening for malignant neoplasm of respiratory organs: Secondary | ICD-10-CM

## 2022-07-26 DIAGNOSIS — Z87891 Personal history of nicotine dependence: Secondary | ICD-10-CM

## 2022-08-11 ENCOUNTER — Other Ambulatory Visit: Payer: Self-pay | Admitting: Family

## 2022-08-11 DIAGNOSIS — E538 Deficiency of other specified B group vitamins: Secondary | ICD-10-CM

## 2022-08-12 ENCOUNTER — Other Ambulatory Visit: Payer: Self-pay

## 2022-08-12 MED ORDER — CYANOCOBALAMIN 1000 MCG/ML IJ SOLN
INTRAMUSCULAR | 4 refills | Status: DC
  Filled 2022-08-12: qty 3, 90d supply, fill #0
  Filled 2022-11-24: qty 3, 90d supply, fill #1
  Filled 2023-03-16: qty 3, 90d supply, fill #2

## 2022-08-13 ENCOUNTER — Other Ambulatory Visit (HOSPITAL_COMMUNITY): Payer: Self-pay

## 2022-08-20 ENCOUNTER — Other Ambulatory Visit (HOSPITAL_COMMUNITY): Payer: Self-pay

## 2022-08-21 ENCOUNTER — Other Ambulatory Visit: Payer: Self-pay

## 2022-08-21 ENCOUNTER — Other Ambulatory Visit (HOSPITAL_COMMUNITY): Payer: Self-pay

## 2022-08-24 ENCOUNTER — Other Ambulatory Visit: Payer: Self-pay

## 2022-08-24 ENCOUNTER — Other Ambulatory Visit (HOSPITAL_COMMUNITY): Payer: Self-pay

## 2022-08-27 ENCOUNTER — Other Ambulatory Visit: Payer: Self-pay

## 2022-09-11 DIAGNOSIS — H43823 Vitreomacular adhesion, bilateral: Secondary | ICD-10-CM | POA: Diagnosis not present

## 2022-09-11 DIAGNOSIS — H31001 Unspecified chorioretinal scars, right eye: Secondary | ICD-10-CM | POA: Diagnosis not present

## 2022-09-11 DIAGNOSIS — E113513 Type 2 diabetes mellitus with proliferative diabetic retinopathy with macular edema, bilateral: Secondary | ICD-10-CM | POA: Diagnosis not present

## 2022-09-15 ENCOUNTER — Other Ambulatory Visit (HOSPITAL_COMMUNITY): Payer: Self-pay

## 2022-09-18 ENCOUNTER — Other Ambulatory Visit: Payer: Self-pay

## 2022-09-29 ENCOUNTER — Ambulatory Visit
Admission: RE | Admit: 2022-09-29 | Discharge: 2022-09-29 | Disposition: A | Discharge status: Home / Self Care | Payer: Commercial Managed Care - PPO | Source: Ambulatory Visit | Attending: Acute Care | Admitting: Acute Care

## 2022-09-29 DIAGNOSIS — I700 Atherosclerosis of aorta: Secondary | ICD-10-CM | POA: Diagnosis not present

## 2022-09-29 DIAGNOSIS — Z87891 Personal history of nicotine dependence: Secondary | ICD-10-CM | POA: Diagnosis not present

## 2022-09-29 DIAGNOSIS — J439 Emphysema, unspecified: Secondary | ICD-10-CM | POA: Diagnosis not present

## 2022-09-29 DIAGNOSIS — Z122 Encounter for screening for malignant neoplasm of respiratory organs: Secondary | ICD-10-CM | POA: Insufficient documentation

## 2022-09-29 DIAGNOSIS — K760 Fatty (change of) liver, not elsewhere classified: Secondary | ICD-10-CM | POA: Diagnosis not present

## 2022-10-02 ENCOUNTER — Other Ambulatory Visit: Payer: Self-pay

## 2022-10-02 DIAGNOSIS — Z122 Encounter for screening for malignant neoplasm of respiratory organs: Secondary | ICD-10-CM

## 2022-10-02 DIAGNOSIS — Z87891 Personal history of nicotine dependence: Secondary | ICD-10-CM

## 2022-10-07 DIAGNOSIS — E113551 Type 2 diabetes mellitus with stable proliferative diabetic retinopathy, right eye: Secondary | ICD-10-CM | POA: Diagnosis not present

## 2022-10-13 ENCOUNTER — Ambulatory Visit (INDEPENDENT_AMBULATORY_CARE_PROVIDER_SITE_OTHER): Payer: Commercial Managed Care - PPO | Admitting: Family

## 2022-10-13 ENCOUNTER — Encounter: Payer: Self-pay | Admitting: Family

## 2022-10-13 VITALS — BP 130/70 | HR 78 | Temp 98.0°F | Ht 60.0 in | Wt 122.0 lb

## 2022-10-13 DIAGNOSIS — R809 Proteinuria, unspecified: Secondary | ICD-10-CM

## 2022-10-13 DIAGNOSIS — Z0000 Encounter for general adult medical examination without abnormal findings: Secondary | ICD-10-CM

## 2022-10-13 DIAGNOSIS — K760 Fatty (change of) liver, not elsewhere classified: Secondary | ICD-10-CM

## 2022-10-13 DIAGNOSIS — E1129 Type 2 diabetes mellitus with other diabetic kidney complication: Secondary | ICD-10-CM

## 2022-10-13 DIAGNOSIS — Z7985 Long-term (current) use of injectable non-insulin antidiabetic drugs: Secondary | ICD-10-CM | POA: Diagnosis not present

## 2022-10-13 DIAGNOSIS — Z124 Encounter for screening for malignant neoplasm of cervix: Secondary | ICD-10-CM

## 2022-10-13 DIAGNOSIS — R7309 Other abnormal glucose: Secondary | ICD-10-CM

## 2022-10-13 LAB — POCT GLYCOSYLATED HEMOGLOBIN (HGB A1C): Hemoglobin A1C: 7.2 % — AB (ref 4.0–5.6)

## 2022-10-13 NOTE — Progress Notes (Signed)
Assessment & Plan:  Screening for cervical cancer  Elevated glucose -     POCT glycosylated hemoglobin (Hb A1C)  Hepatic steatosis Assessment & Plan: Seen CT chest 09/2022 and discussed this finding with patient. She declines RUQ [place].  Encouraged healthy lifestyle and increased exercise   Type 2 diabetes mellitus with diabetic microalbuminuria, without long-term current use of insulin [facility]) Assessment & Plan: Lab Results  Component Value Date   HGBA1C 7.2 (A) 10/13/2022   She politely declines adding Jardiance to diabetic regimen; she has a history of microalbuminemia.  Continue Ozempic 2 mg.  She will work on increasing exercise.  Follow-up in 3 months.    Routine physical examination Assessment & Plan: Clinical breast exam performed today.  Deferred pelvic exam in the absence of complaints and Pap smear is up-to-date.  Encouraged routine walking regimen      Return precautions given.   Risks, benefits, and alternatives of the medications and treatment plan prescribed today were discussed, and patient expressed understanding.   Education regarding symptom management and diagnosis given to patient on AVS either electronically or printed.  Return in about 3 months (around 01/13/2023).  Rachael Plowman, FNP  Subjective:    Patient ID: Rachael Weeks, female    DOB: 1960-12-16, 62 y.o.   MRN: [id number]  CC: Rachael Weeks is a 62 y.o. female who presents today for physical exam.    HPI: Feels well today.  No new complaints  She is tolerating ozempic 2mg . She is not taking losartan 12.5mg  due to prior h/o dizziness.   No dizziness, cp, sob.    FBG 136 -150.    Colorectal Cancer Screening: UTD , Dr Allegra Lai. Repeat in 10 years.  Breast Cancer Screening: Mammogram UTD Cervical Cancer Screening: UTD, transformation zone present. HPV, NIL negative 12/17/2016 atropy, NIL, negative HPV   Bone Health screening/DEXA for 65+: No increased fracture risk. Defer screening at  this time.  Lung Cancer Screening: Doesn't have 20 year pack year history and age > 42 years yo 32 years        Tetanus - UTD        Exercise: Gets regular exercise.  She is trying to walk more and establish a routine.  Alcohol use:  none Smoking/tobacco use: Nonsmoker.    Health Maintenance  Topic Date Due   Eye exam for diabetics  06/25/2022   Flu Shot  12/31/2022   Complete foot exam   03/18/2023   Hemoglobin A1C  04/15/2023   Yearly kidney function blood test for diabetes  06/24/2023   Yearly kidney health urinalysis for diabetes  07/09/2023   Mammogram  02/14/2024   Pap Smear  07/11/2024   Colon Cancer Screening  07/29/2028   DTaP/Tdap/Td vaccine (2 - Td or Tdap) 01/07/2031   Hepatitis C Screening: USPSTF Recommendation to screen - Ages 55-79 yo.  Completed   HIV Screening  Completed   Zoster (Shingles) Vaccine  Completed   HPV Vaccine  Aged Out   COVID-19 Vaccine  Discontinued    ALLERGIES: Patient has no known allergies.  Current Outpatient Medications on File Prior to Visit  Medication Sig Dispense Refill   Blood Glucose Monitoring Suppl (FREESTYLE FREEDOM LITE) w/Device KIT 1 each by Does not apply route daily. 1 kit 0   cyanocobalamin (VITAMIN B12) 1000 MCG/ML injection 1000 mcg (1 mL) intramuscular injection in the thigh ( vastus lateralis) once per month. 3 mL 4   folic acid (FOLVITE) 1 MG tablet Take 1  tablet (1 mg total) by mouth once daily 90 tablet 3   gabapentin (NEURONTIN) 100 MG capsule Take 1 capsule (100 mg total) by mouth every evening. 90 capsule 3   glucose blood (FREESTYLE LITE) test strip USED TO CHECK BLOOD GLUCOSE 2 TIMES A DAY. 100 each 5   Ixekizumab (TALTZ) 80 MG/ML SOAJ Inject 80mg  at week 12 then every 4 weeks as directed 1 mL 10   Lancet Devices (B-D LANCET DEVICE) MISC Inject 1 application into the skin 4 (four) times daily. 100 each 3   Lancets (FREESTYLE) lancets USED TO CHECK BLOOD GLUCOSE LEVELS 2 TIMES A DAY. 100 each 5   methotrexate  (RHEUMATREX) 2.5 MG tablet Take 8 tablets (20 mg total) by mouth every 7 (seven) days All on the same day 32 tablet 5   moxifloxacin (VIGAMOX) 0.5 % ophthalmic solution 1 gtt QID OU x 7 days 3 mL 0   NEEDLE, DISP, 25 G (BD ECLIPSE NEEDLE) 25G X 1" MISC Used to give monthly B-12 injections. 50 each 1   prednisoLONE acetate (PRED FORTE) 1 % ophthalmic suspension Apply 1 drop into left eye four times a day as directed. Shake well before each use! 5 mL 1   Semaglutide, 2 MG/DOSE, 8 MG/3ML SOPN Inject 2 mg as directed once a week. 3 mL 3   SYRINGE/NEEDLE, DISP, 1 ML (BD LUER-LOK SYRINGE) 25G X 5/8" 1 ML MISC Used to give monthly B-12 injections. 50 each 1   tobramycin (TOBREX) 0.3 % ophthalmic solution Instill 1 drop in both eyes four times a day; Begin one day prior to procedure, use day of procedure, one day after then stop 5 mL 5   tobramycin (TOBREX) 0.3 % ophthalmic solution Instill 1 drop into both eyes four times a day; Begin one day prior to procedure, use day of procedure, one day after then stop 5 mL 2   No current facility-administered medications on file prior to visit.    Review of Systems  Constitutional:  Negative for chills, fever and unexpected weight change.  HENT:  Negative for congestion.   Respiratory:  Negative for cough.   Cardiovascular:  Negative for chest pain, palpitations and leg swelling.  Gastrointestinal:  Negative for nausea and vomiting.  Musculoskeletal:  Negative for arthralgias and myalgias.  Skin:  Negative for rash.  Neurological:  Negative for headaches.  Hematological:  Negative for adenopathy.  Psychiatric/Behavioral:  Negative for confusion.       Objective:    BP 130/70   Pulse 78   Temp 98 F (36.7 C) (Oral)   Ht 5' (1.524 m)   Wt 122 lb (55.3 kg)   SpO2 98%   BMI 23.83 kg/m   BP Readings from Last 3 Encounters:  10/13/22 130/70  07/08/22 136/78  06/23/22 (!) 144/76   Wt Readings from Last 3 Encounters:  10/13/22 122 lb (55.3 kg)   07/08/22 118 lb 6.4 oz (53.7 kg)  06/23/22 124 lb 6.4 oz (56.4 kg)    Physical Exam Vitals reviewed.  Constitutional:      Appearance: Normal appearance. She is well-developed.  Eyes:     Conjunctiva/sclera: Conjunctivae normal.  Neck:     Thyroid: No thyroid mass or thyromegaly.  Cardiovascular:     Rate and Rhythm: Normal rate and regular rhythm.     Pulses: Normal pulses.     Heart sounds: Normal heart sounds.  Pulmonary:     Effort: Pulmonary effort is normal.     Breath sounds:  Normal breath sounds. No wheezing, rhonchi or rales.  Chest:  Breasts:    Breasts are symmetrical.     Right: No inverted nipple, mass, nipple discharge, skin change or tenderness.     Left: No inverted nipple, mass, nipple discharge, skin change or tenderness.  Abdominal:     General: Bowel sounds are normal. There is no distension.     Palpations: Abdomen is soft. Abdomen is not rigid. There is no fluid wave or mass.     Tenderness: There is no abdominal tenderness. There is no guarding or rebound.  Lymphadenopathy:     Head:     Right side of head: No submental, submandibular, tonsillar, preauricular, posterior auricular or occipital adenopathy.     Left side of head: No submental, submandibular, tonsillar, preauricular, posterior auricular or occipital adenopathy.     Cervical: No cervical adenopathy.     Right cervical: No superficial, deep or posterior cervical adenopathy.    Left cervical: No superficial, deep or posterior cervical adenopathy.  Skin:    General: Skin is warm and dry.  Neurological:     Mental Status: She is alert.  Psychiatric:        Speech: Speech normal.        Behavior: Behavior normal.        Thought Content: Thought content normal.

## 2022-10-13 NOTE — Assessment & Plan Note (Signed)
Lab Results  Component Value Date   HGBA1C 7.2 (A) 10/13/2022   She politely declines adding Jardiance to diabetic regimen; she has a history of microalbuminemia.  Continue Ozempic 2 mg.  She will work on increasing exercise.  Follow-up in 3 months.

## 2022-10-13 NOTE — Assessment & Plan Note (Signed)
Seen CT chest 09/2022 and discussed this finding with patient. She declines RUQ [place].  Encouraged healthy lifestyle and increased exercise

## 2022-10-14 ENCOUNTER — Other Ambulatory Visit (HOSPITAL_COMMUNITY): Payer: Self-pay

## 2022-10-16 NOTE — Assessment & Plan Note (Signed)
Clinical breast exam performed today.  Deferred pelvic exam in the absence of complaints and Pap smear is up-to-date.  Encouraged routine walking regimen

## 2022-10-20 ENCOUNTER — Other Ambulatory Visit (HOSPITAL_COMMUNITY): Payer: Self-pay

## 2022-10-23 ENCOUNTER — Other Ambulatory Visit: Payer: Self-pay

## 2022-10-23 DIAGNOSIS — L4050 Arthropathic psoriasis, unspecified: Secondary | ICD-10-CM | POA: Diagnosis not present

## 2022-10-23 DIAGNOSIS — L400 Psoriasis vulgaris: Secondary | ICD-10-CM | POA: Diagnosis not present

## 2022-10-23 DIAGNOSIS — Z7960 Long term (current) use of unspecified immunomodulators and immunosuppressants: Secondary | ICD-10-CM | POA: Diagnosis not present

## 2022-10-23 MED ORDER — FOLIC ACID 1 MG PO TABS
1.0000 mg | ORAL_TABLET | Freq: Every day | ORAL | 3 refills | Status: AC
  Filled 2022-10-23: qty 90, 90d supply, fill #0
  Filled 2023-02-11: qty 90, 90d supply, fill #1
  Filled 2023-09-07: qty 90, 90d supply, fill #2

## 2022-10-23 MED ORDER — CLOBETASOL PROPIONATE 0.05 % EX OINT
1.0000 | TOPICAL_OINTMENT | Freq: Two times a day (BID) | CUTANEOUS | 5 refills | Status: AC
  Filled 2022-10-23: qty 30, 15d supply, fill #0
  Filled 2023-03-16: qty 30, 15d supply, fill #1
  Filled 2023-09-07: qty 30, 15d supply, fill #2

## 2022-10-23 MED ORDER — METHOTREXATE SODIUM 2.5 MG PO TABS
20.0000 mg | ORAL_TABLET | ORAL | 5 refills | Status: DC
  Filled 2022-10-23: qty 24, 21d supply, fill #0
  Filled 2022-10-23: qty 8, 7d supply, fill #0
  Filled 2022-11-24: qty 32, 28d supply, fill #1
  Filled 2022-12-31: qty 32, 28d supply, fill #2
  Filled 2023-02-11: qty 32, 28d supply, fill #3
  Filled 2023-03-16: qty 32, 28d supply, fill #4
  Filled 2023-06-04: qty 32, 28d supply, fill #5

## 2022-10-23 MED ORDER — TALTZ 80 MG/ML ~~LOC~~ SOAJ
80.0000 mg | SUBCUTANEOUS | 3 refills | Status: DC
  Filled 2022-11-12: qty 1, 28d supply, fill #0

## 2022-10-27 ENCOUNTER — Encounter: Payer: Self-pay | Admitting: Family

## 2022-11-04 DIAGNOSIS — H31001 Unspecified chorioretinal scars, right eye: Secondary | ICD-10-CM | POA: Diagnosis not present

## 2022-11-04 DIAGNOSIS — E113513 Type 2 diabetes mellitus with proliferative diabetic retinopathy with macular edema, bilateral: Secondary | ICD-10-CM | POA: Diagnosis not present

## 2022-11-04 DIAGNOSIS — H43823 Vitreomacular adhesion, bilateral: Secondary | ICD-10-CM | POA: Diagnosis not present

## 2022-11-04 DIAGNOSIS — Z9889 Other specified postprocedural states: Secondary | ICD-10-CM | POA: Diagnosis not present

## 2022-11-12 ENCOUNTER — Other Ambulatory Visit (HOSPITAL_COMMUNITY): Payer: Self-pay

## 2022-11-17 ENCOUNTER — Other Ambulatory Visit (HOSPITAL_COMMUNITY): Payer: Self-pay

## 2022-11-17 ENCOUNTER — Other Ambulatory Visit: Payer: Self-pay | Admitting: Pharmacist

## 2022-11-17 MED ORDER — TALTZ 80 MG/ML ~~LOC~~ SOAJ
80.0000 mg | SUBCUTANEOUS | 3 refills | Status: DC
  Filled 2022-11-17: qty 1, 28d supply, fill #0
  Filled 2022-12-09: qty 1, 28d supply, fill #1
  Filled 2023-01-06: qty 1, 28d supply, fill #2
  Filled 2023-02-03: qty 1, 28d supply, fill #3
  Filled 2023-03-03: qty 1, 28d supply, fill #4
  Filled 2023-03-31: qty 1, 28d supply, fill #5
  Filled 2023-04-22: qty 1, 28d supply, fill #6
  Filled 2023-06-01: qty 1, 28d supply, fill #7
  Filled 2023-06-29: qty 1, 28d supply, fill #8
  Filled 2023-07-29: qty 1, 28d supply, fill #9
  Filled 2023-08-26: qty 1, 28d supply, fill #10
  Filled 2023-09-28: qty 1, 28d supply, fill #11

## 2022-11-18 ENCOUNTER — Other Ambulatory Visit: Payer: Self-pay

## 2022-11-24 ENCOUNTER — Other Ambulatory Visit: Payer: Self-pay

## 2022-11-24 ENCOUNTER — Other Ambulatory Visit: Payer: Self-pay | Admitting: Family

## 2022-11-24 DIAGNOSIS — E119 Type 2 diabetes mellitus without complications: Secondary | ICD-10-CM

## 2022-11-24 MED ORDER — OZEMPIC (2 MG/DOSE) 8 MG/3ML ~~LOC~~ SOPN
2.0000 mg | PEN_INJECTOR | SUBCUTANEOUS | 3 refills | Status: DC
Start: 2022-11-24 — End: 2023-05-03
  Filled 2022-11-24: qty 3, 28d supply, fill #0
  Filled 2022-12-31: qty 3, 28d supply, fill #1
  Filled 2023-02-11: qty 3, 28d supply, fill #2
  Filled 2023-03-16: qty 3, 28d supply, fill #3

## 2022-12-09 ENCOUNTER — Other Ambulatory Visit: Payer: Self-pay

## 2022-12-14 ENCOUNTER — Other Ambulatory Visit (HOSPITAL_COMMUNITY): Payer: Self-pay

## 2022-12-30 ENCOUNTER — Encounter (INDEPENDENT_AMBULATORY_CARE_PROVIDER_SITE_OTHER): Payer: Self-pay

## 2022-12-30 DIAGNOSIS — E113513 Type 2 diabetes mellitus with proliferative diabetic retinopathy with macular edema, bilateral: Secondary | ICD-10-CM | POA: Diagnosis not present

## 2022-12-30 DIAGNOSIS — Z9889 Other specified postprocedural states: Secondary | ICD-10-CM | POA: Diagnosis not present

## 2022-12-30 DIAGNOSIS — H43823 Vitreomacular adhesion, bilateral: Secondary | ICD-10-CM | POA: Diagnosis not present

## 2023-01-06 ENCOUNTER — Other Ambulatory Visit (HOSPITAL_COMMUNITY): Payer: Self-pay

## 2023-01-12 ENCOUNTER — Other Ambulatory Visit: Payer: Self-pay

## 2023-01-15 ENCOUNTER — Encounter: Payer: Self-pay | Admitting: Family

## 2023-01-15 ENCOUNTER — Ambulatory Visit: Payer: Commercial Managed Care - PPO | Admitting: Family

## 2023-01-15 VITALS — BP 127/68 | HR 80 | Temp 97.4°F | Ht 60.0 in | Wt 121.8 lb

## 2023-01-15 DIAGNOSIS — I700 Atherosclerosis of aorta: Secondary | ICD-10-CM

## 2023-01-15 DIAGNOSIS — E1129 Type 2 diabetes mellitus with other diabetic kidney complication: Secondary | ICD-10-CM

## 2023-01-15 DIAGNOSIS — Z7985 Long-term (current) use of injectable non-insulin antidiabetic drugs: Secondary | ICD-10-CM

## 2023-01-15 DIAGNOSIS — R809 Proteinuria, unspecified: Secondary | ICD-10-CM | POA: Diagnosis not present

## 2023-01-15 LAB — POCT GLYCOSYLATED HEMOGLOBIN (HGB A1C)
HbA1c POC (<> result, manual entry): 6.8 % (ref 4.0–5.6)
HbA1c, POC (controlled diabetic range): 6.8 % (ref 0.0–7.0)
HbA1c, POC (prediabetic range): 6.8 % — AB (ref 5.7–6.4)
Hemoglobin A1C: 6.8 % — AB (ref 4.0–5.6)

## 2023-01-15 NOTE — Assessment & Plan Note (Signed)
Lipid Panel     Component Value Date/Time   CHOL 94 03/17/2022 1101   CHOL 85 12/12/2012 1315   TRIG 21.0 03/17/2022 1101   TRIG 20 12/12/2012 1315   HDL 58.90 03/17/2022 1101   HDL 58 12/12/2012 1315   CHOLHDL 2 03/17/2022 1101   VLDL 4.2 03/17/2022 1101   VLDL 4 (L) 12/12/2012 1315   LDLCALC 31 03/17/2022 1101   LDLCALC 23 12/12/2012 1315   Chronic, symptomatically stable.  Will continue to monitor lipid panel.  Patient is not a statin candidate with coronary calcium score of 0 ( 01/01/2017) and LDL 23.  Will monitor

## 2023-01-15 NOTE — Assessment & Plan Note (Signed)
Lab Results  Component Value Date   HGBA1C 6.8 (A) 01/15/2023   HGBA1C 6.8 01/15/2023   HGBA1C 6.8 (A) 01/15/2023   HGBA1C 6.8 01/15/2023     Improved.  Continue Ozempic 2 mg.   Follow-up in 3 months.

## 2023-01-15 NOTE — Progress Notes (Signed)
Assessment & Plan:  Type 2 diabetes mellitus with diabetic microalbuminuria, without long-term current use of insulin [facility]) Assessment & Plan: Lab Results  Component Value Date   HGBA1C 6.8 (A) 01/15/2023   HGBA1C 6.8 01/15/2023   HGBA1C 6.8 (A) 01/15/2023   HGBA1C 6.8 01/15/2023     Improved.  Continue Ozempic 2 mg.   Follow-up in 3 months.   Orders: -     POCT glycosylated hemoglobin (Hb A1C)  Atherosclerosis of aorta [facility]) Assessment & Plan: Lipid Panel     Component Value Date/Time   CHOL 94 03/17/2022 1101   CHOL 85 12/12/2012 1315   TRIG 21.0 03/17/2022 1101   TRIG 20 12/12/2012 1315   HDL 58.90 03/17/2022 1101   HDL 58 12/12/2012 1315   CHOLHDL 2 03/17/2022 1101   VLDL 4.2 03/17/2022 1101   VLDL 4 (L) 12/12/2012 1315   LDLCALC 31 03/17/2022 1101   LDLCALC 23 12/12/2012 1315   Chronic, symptomatically stable.  Will continue to monitor lipid panel.  Patient is not a statin candidate with coronary calcium score of 0 ( 01/01/2017) and LDL 23.  Will monitor      Return precautions given.   Risks, benefits, and alternatives of the medications and treatment plan prescribed today were discussed, and patient expressed understanding.   Education regarding symptom management and diagnosis given to patient on AVS either electronically or printed.  Return in about 3 months (around 04/17/2023).  [name], FNP  Subjective:    Patient ID: Rachael Weeks, female    DOB: 06/18/1960, 62 y.o.   MRN: [id number]  CC: Rachael Weeks is a 62 y.o. female who presents today for DM follow up.   HPI: Feels well today.  No new complaints  Tolerating Ozempic well.  She checks blood pressure regularly with readings in the 120s /70s.   Denies chest pain, shortness of breath    Allergies: Patient has no known allergies. Current Outpatient Medications on File Prior to Visit  Medication Sig Dispense Refill   Blood Glucose Monitoring Suppl (FREESTYLE FREEDOM LITE) w/Device  KIT 1 each by Does not apply route daily. 1 kit 0   clobetasol ointment (TEMOVATE) 0.05 % Apply 1 Application topically 2 (two) times daily. 30 g 5   cyanocobalamin (VITAMIN B12) 1000 MCG/ML injection 1000 mcg (1 mL) intramuscular injection in the thigh ( vastus lateralis) once per month. 3 mL 4   folic acid (FOLVITE) 1 MG tablet Take 1 tablet (1 mg total) by mouth daily. 90 tablet 3   glucose blood (FREESTYLE LITE) test strip USED TO CHECK BLOOD GLUCOSE 2 TIMES A DAY. 100 each 5   Ixekizumab (TALTZ) 80 MG/ML SOAJ Inject 80 mg into the skin as directed. 3 mL 3   Lancet Devices (B-D LANCET DEVICE) MISC Inject 1 application into the skin 4 (four) times daily. 100 each 3   Lancets (FREESTYLE) lancets USED TO CHECK BLOOD GLUCOSE LEVELS 2 TIMES A DAY. 100 each 5   methotrexate (RHEUMATREX) 2.5 MG tablet Take 8 tablets (20 mg total) by mouth every 7 (seven) days. All on the same day. 32 tablet 5   NEEDLE, DISP, 25 G (BD ECLIPSE NEEDLE) 25G X 1" MISC Used to give monthly B-12 injections. 50 each 1   Semaglutide, 2 MG/DOSE, (OZEMPIC, 2 MG/DOSE,) 8 MG/3ML SOPN Inject 2 mg as directed once a week. 3 mL 3   SYRINGE/NEEDLE, DISP, 1 ML (BD LUER-LOK SYRINGE) 25G X 5/8" 1 ML MISC Used to  give monthly B-12 injections. 50 each 1   tobramycin (TOBREX) 0.3 % ophthalmic solution Instill 1 drop in both eyes four times a day; Begin one day prior to procedure, use day of procedure, one day after then stop 5 mL 5   tobramycin (TOBREX) 0.3 % ophthalmic solution Instill 1 drop into both eyes four times a day; Begin one day prior to procedure, use day of procedure, one day after then stop 5 mL 2   No current facility-administered medications on file prior to visit.    Review of Systems  Constitutional:  Negative for chills and fever.  Respiratory:  Negative for cough.   Cardiovascular:  Negative for chest pain and palpitations.  Gastrointestinal:  Negative for nausea and vomiting.      Objective:    BP 127/68    Pulse 80   Temp (!) 97.4 F (36.3 C) (Oral)   Ht 5' (1.524 m)   Wt 121 lb 12.8 oz (55.2 kg)   SpO2 96%   BMI 23.79 kg/m  BP Readings from Last 3 Encounters:  01/15/23 127/68  10/13/22 130/70  07/08/22 136/78   Wt Readings from Last 3 Encounters:  01/15/23 121 lb 12.8 oz (55.2 kg)  10/13/22 122 lb (55.3 kg)  07/08/22 118 lb 6.4 oz (53.7 kg)    Physical Exam Vitals reviewed.  Constitutional:      Appearance: She is well-developed.  Eyes:     Conjunctiva/sclera: Conjunctivae normal.  Cardiovascular:     Rate and Rhythm: Normal rate and regular rhythm.     Pulses: Normal pulses.     Heart sounds: Normal heart sounds.  Pulmonary:     Effort: Pulmonary effort is normal.     Breath sounds: Normal breath sounds. No wheezing, rhonchi or rales.  Skin:    General: Skin is warm and dry.  Neurological:     Mental Status: She is alert.  Psychiatric:        Speech: Speech normal.        Behavior: Behavior normal.        Thought Content: Thought content normal.

## 2023-01-16 IMAGING — DX DG HAND COMPLETE 3+V*R*
3 series · 3 of 3 positions shown · non-contrast
Comparison: None.

CLINICAL DATA: Pain at the base of thumb

EXAM:
RIGHT HAND - COMPLETE 3+ VIEW

[hand pa]
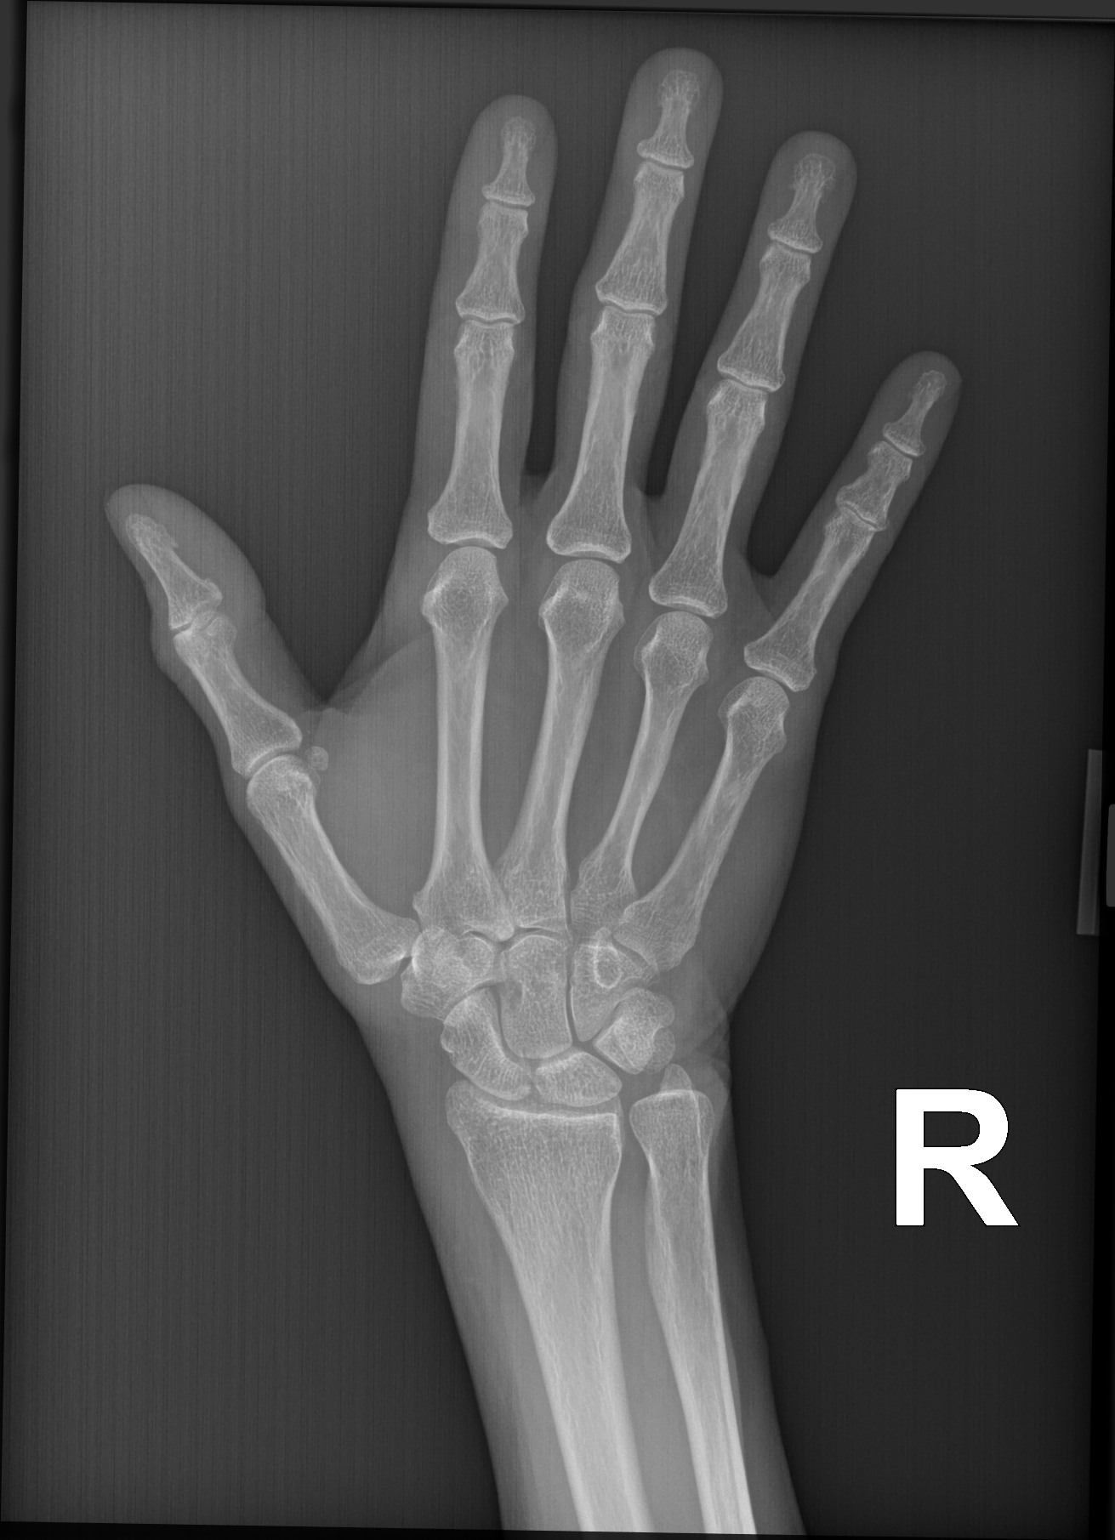

[hand obl (oblique)]
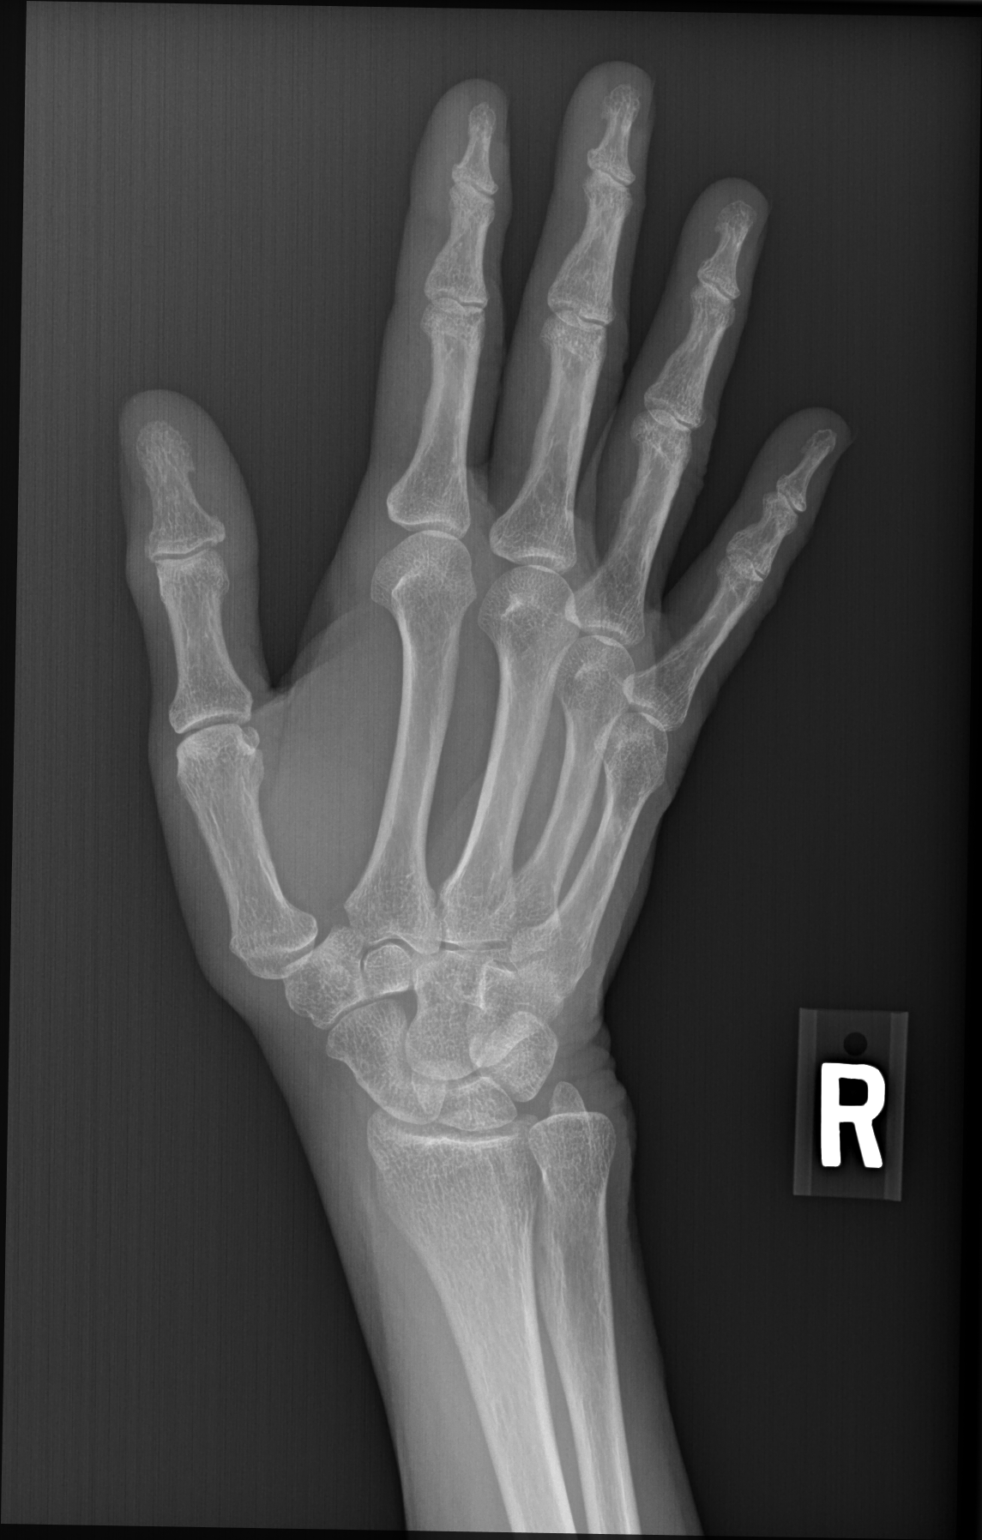

[hand lat]
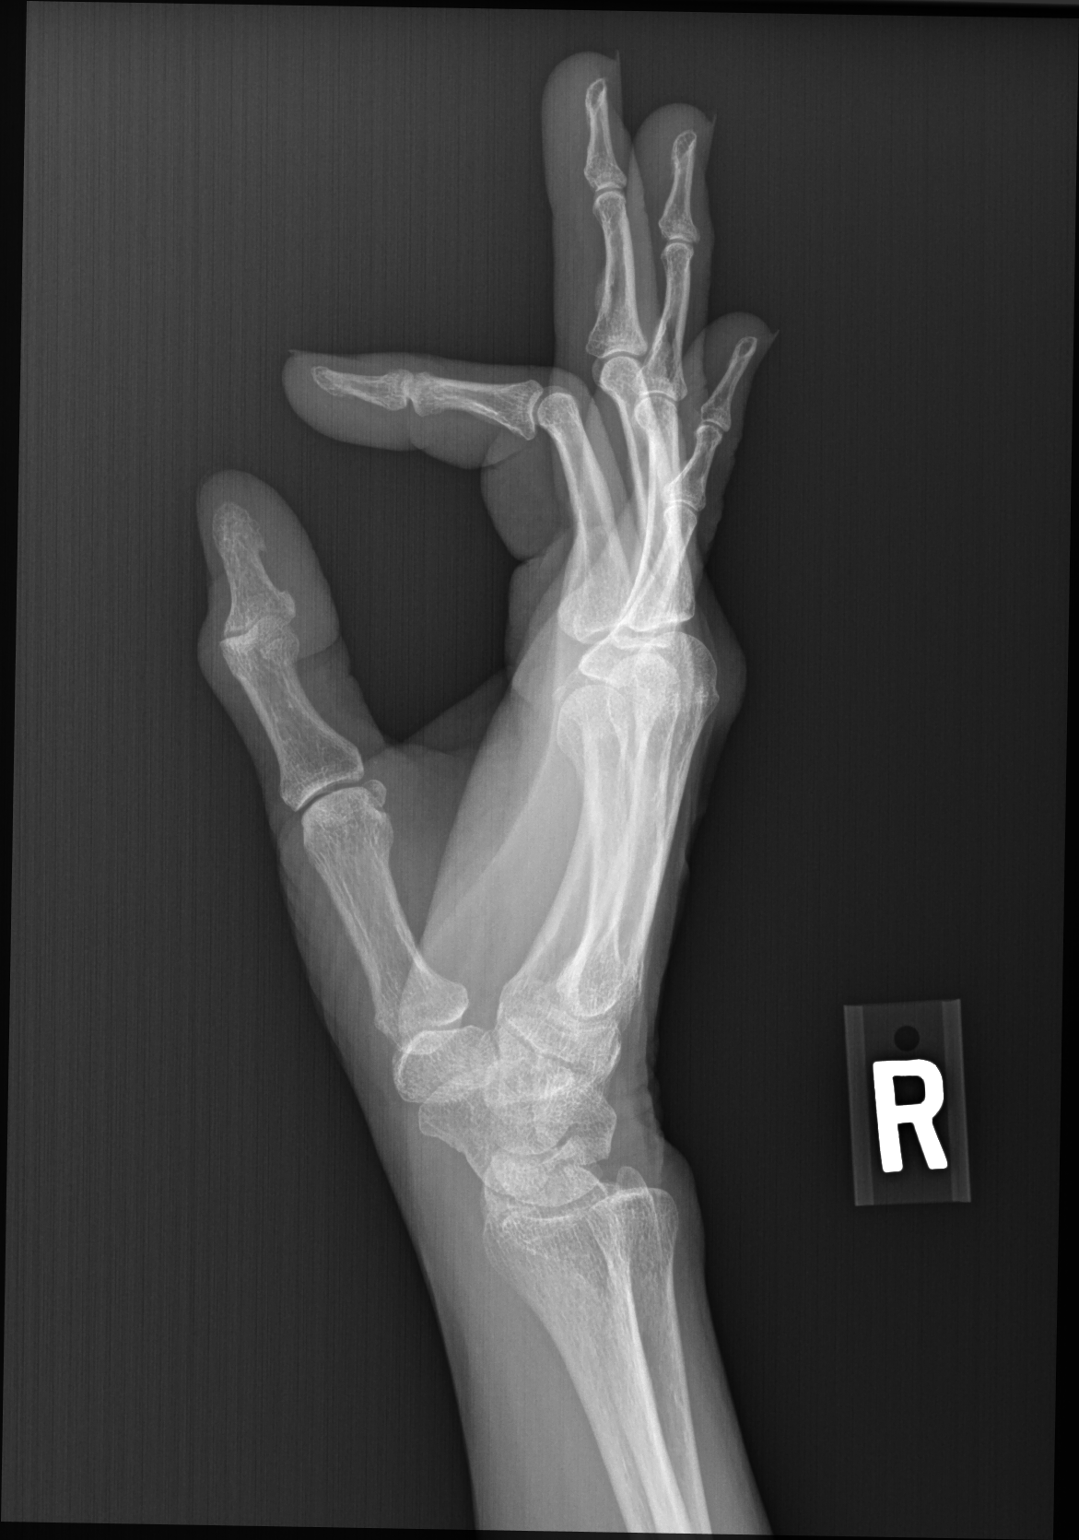

[3 of 3 positions shown; findings below may reference images not displayed]

FINDINGS: There is no evidence of fracture or dislocation. There is no
evidence of arthropathy or other focal bone abnormality. Soft
tissues are unremarkable.
IMPRESSION: Negative.

## 2023-01-23 IMAGING — MG MM DIGITAL SCREENING BILAT W/ TOMO AND CAD
8 series · 8 of 24 positions shown · non-contrast
Comparison: Previous exam(s).

CLINICAL DATA: Screening.

EXAM:
DIGITAL SCREENING BILATERAL MAMMOGRAM WITH TOMOSYNTHESIS AND CAD
TECHNIQUE: Bilateral screening digital craniocaudal and mediolateral oblique
mammograms were obtained. Bilateral screening digital breast
tomosynthesis was performed. The images were evaluated with
computer-aided detection.

[R CC synth-2D]
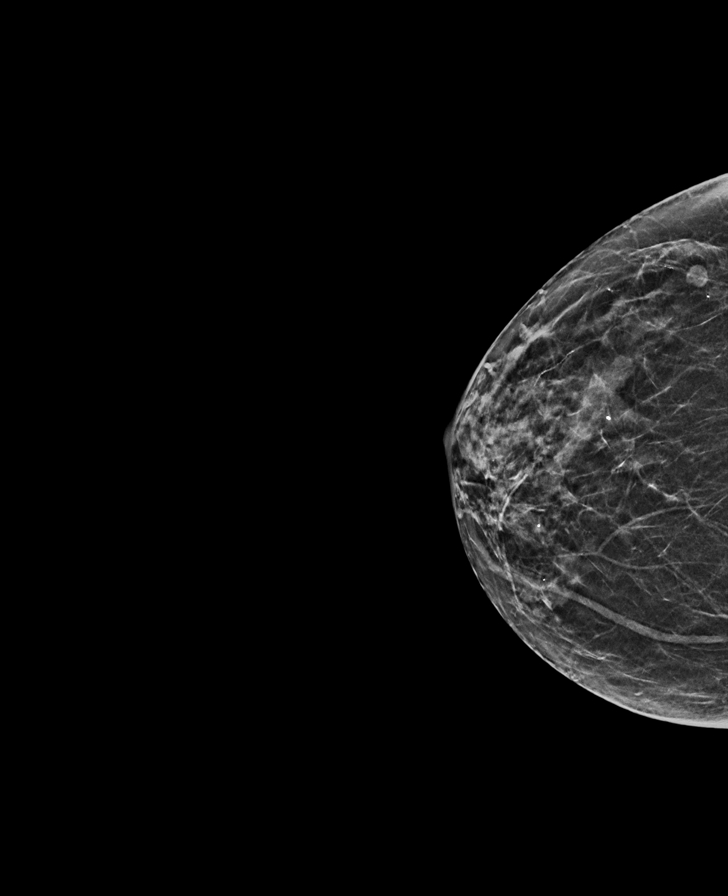

[R MLO synth-2D]
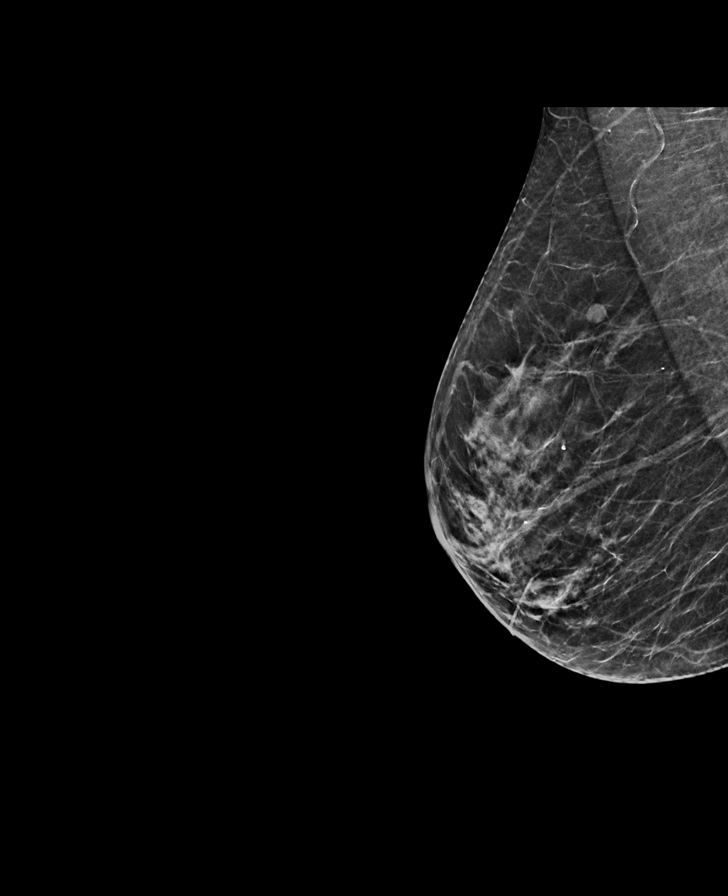

[L CC synth-2D]
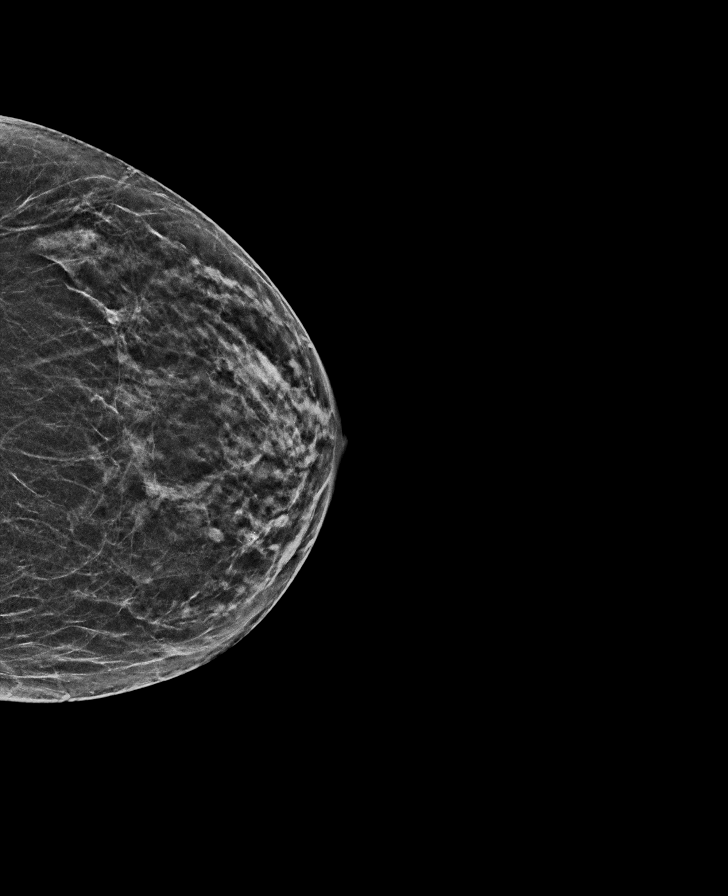

[L MLO synth-2D]
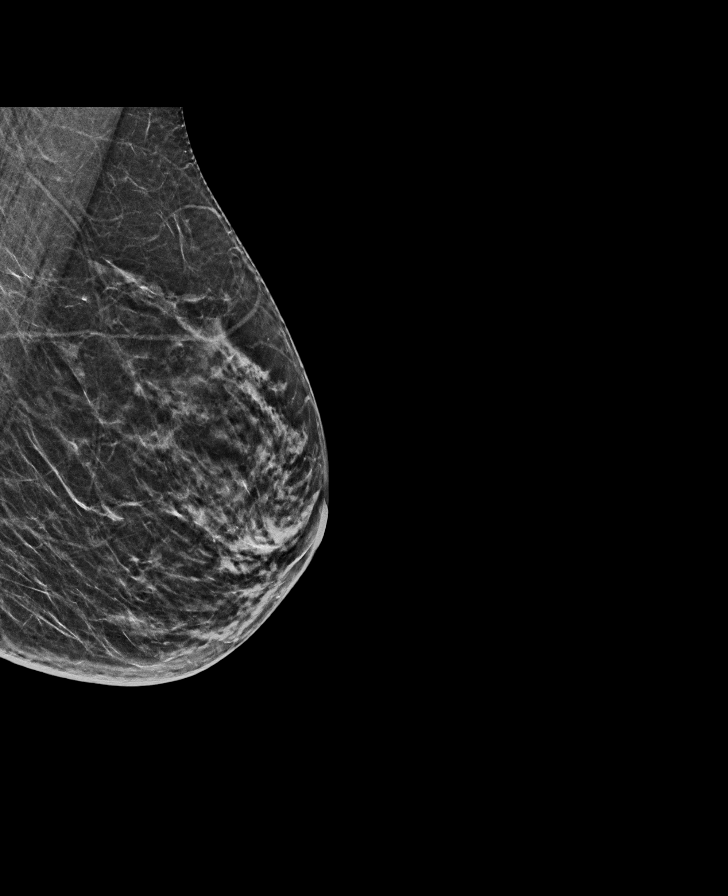

[R MLO tomo · tomo slice 25/49.0]
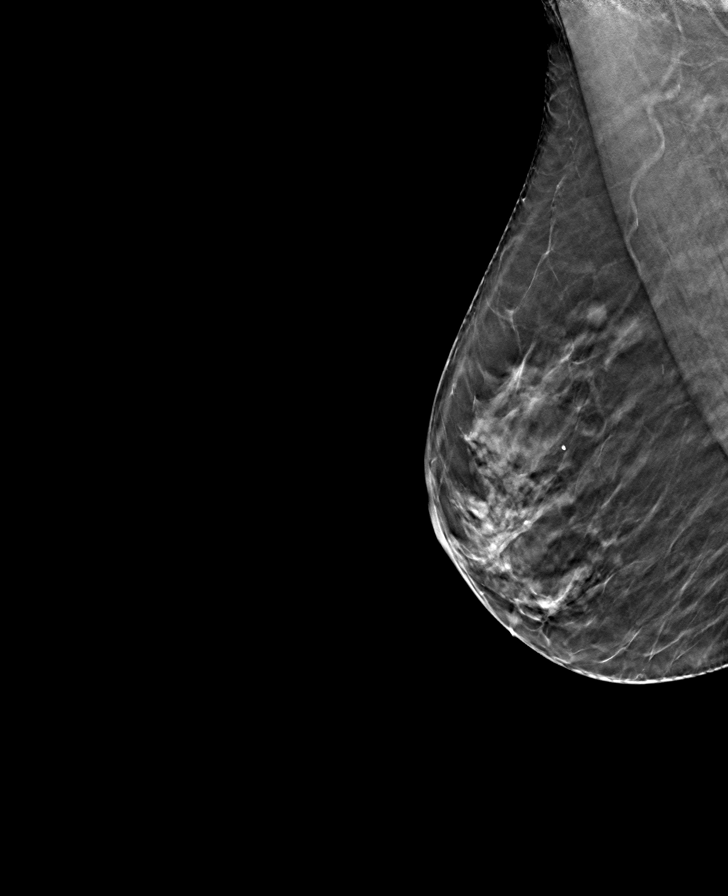

[L CC tomo · tomo slice 23/44.0]
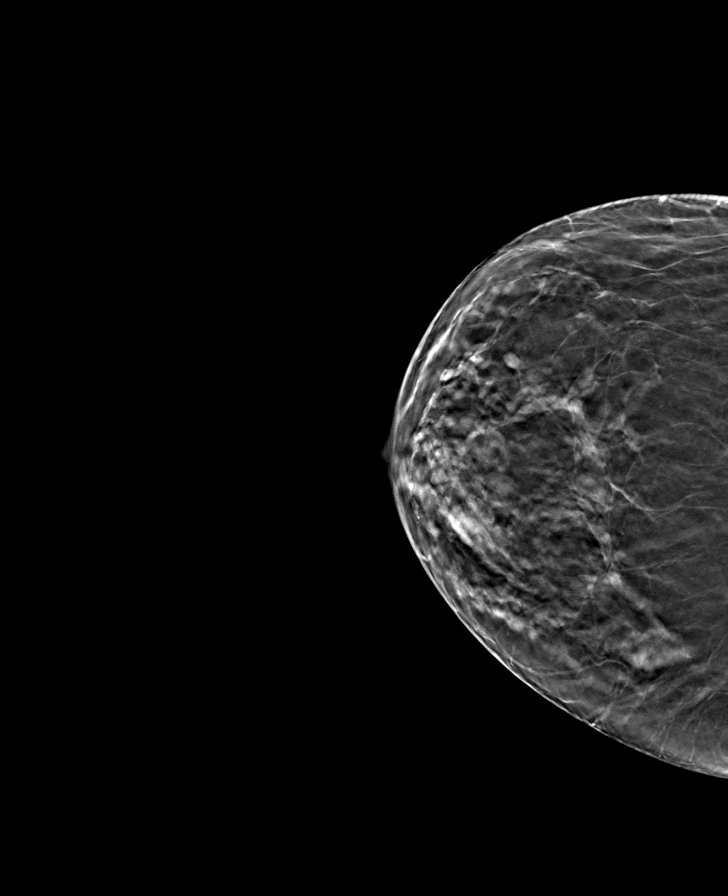

[R CC tomo · tomo slice 23/46.0]
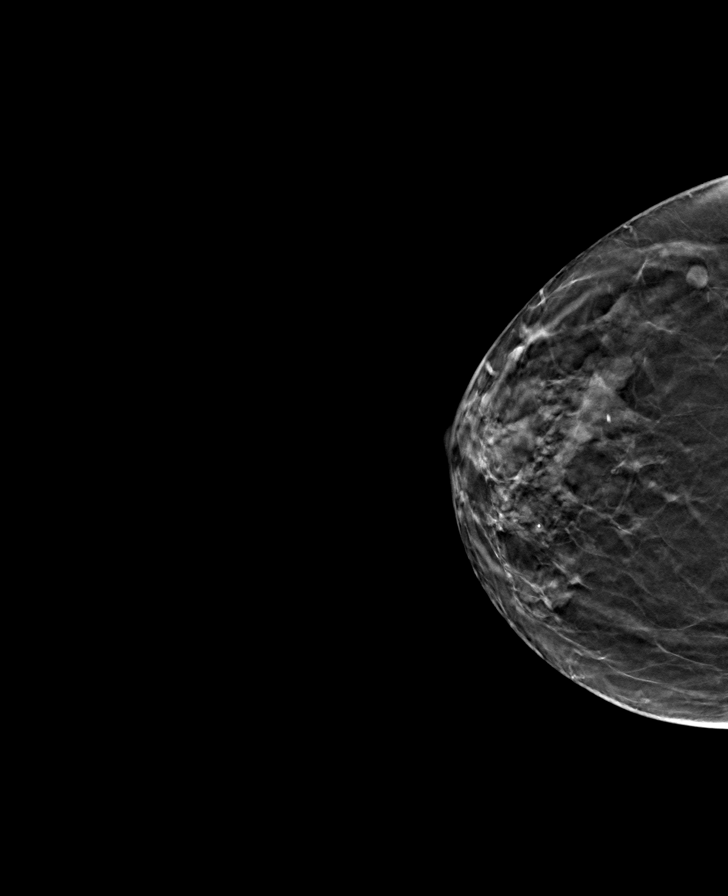

[L MLO tomo · tomo slice 25/48.0]
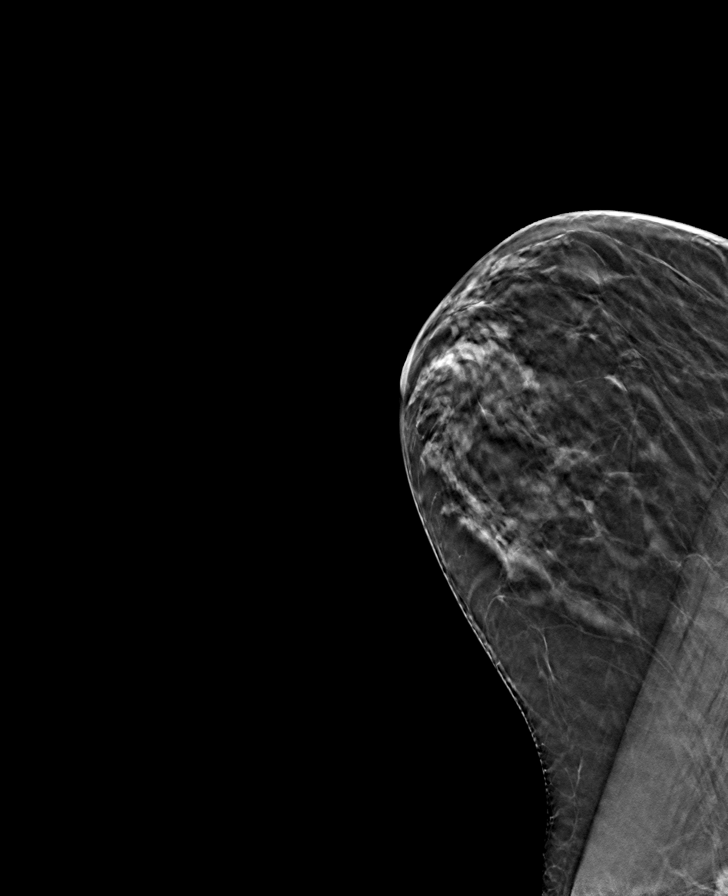

[8 of 24 positions shown; findings below may reference images not displayed]

ACR Breast Density Category b: There are scattered areas of
fibroglandular density.
FINDINGS: There are no findings suspicious for malignancy.
IMPRESSION: No mammographic evidence of malignancy. A result letter of this
screening mammogram will be mailed directly to the patient.

RECOMMENDATION:
Screening mammogram in one year. (Code:51-O-LD2)

BI-RADS CATEGORY  1: Negative.

## 2023-02-03 ENCOUNTER — Other Ambulatory Visit (HOSPITAL_COMMUNITY): Payer: Self-pay

## 2023-02-09 ENCOUNTER — Other Ambulatory Visit: Payer: Self-pay

## 2023-02-10 DIAGNOSIS — E113513 Type 2 diabetes mellitus with proliferative diabetic retinopathy with macular edema, bilateral: Secondary | ICD-10-CM | POA: Diagnosis not present

## 2023-02-10 DIAGNOSIS — E113511 Type 2 diabetes mellitus with proliferative diabetic retinopathy with macular edema, right eye: Secondary | ICD-10-CM | POA: Diagnosis not present

## 2023-02-20 ENCOUNTER — Encounter (HOSPITAL_COMMUNITY): Payer: Self-pay

## 2023-03-02 ENCOUNTER — Ambulatory Visit: Payer: Commercial Managed Care - PPO | Attending: Family | Admitting: Pharmacist

## 2023-03-02 DIAGNOSIS — Z7189 Other specified counseling: Secondary | ICD-10-CM

## 2023-03-02 NOTE — Progress Notes (Signed)
   S: Patient presents today for review of their specialty medication.   Patient is currently taking Taltz for psoriasis, psoriatic arthritis. Patient is managed by Dr. Allena Katz for this.   Dosing: Plaque psoriasis: SubQ: 160 mg once, followed by 80 mg at weeks 2, 4, 6, 8, 10, and 12, and then 80 mg every 4 weeks. Psoriatic arthritis: SubQ: 160 mg once, followed by 80 mg every 4 weeks; may administer alone or in combination with conventional disease-modifying antirheumatic drugs (eg, methotrexate). Note: For psoriatic arthritis patients with coexisting moderate to severe plaque psoriasis, use the dosing regimen for plaque psoriasis.  Adherence: confirms  Efficacy: reports that she believes this is working well for her.   Monitoring:  S/sx infection: none Injection site reactions: some itching for a couple of days after her injection but tolerates this.  S/sx of IBD: none S/sx of hypersensitivity reaction: none  Current adverse effects: none   O:     Lab Results  Component Value Date   WBC 4.4 05/09/2021   HGB 14.1 05/09/2021   HCT 39.4 05/09/2021   MCV 86.8 05/09/2021   PLT 226 05/09/2021      Chemistry      Component Value Date/Time   NA 142 06/23/2022 1134   NA 138 12/12/2012 1315   K 3.8 06/23/2022 1134   K 3.6 12/12/2012 1315   CL 104 06/23/2022 1134   CL 102 12/12/2012 1315   CO2 23 06/23/2022 1134   CO2 30 12/12/2012 1315   BUN 9 06/23/2022 1134   BUN 9 12/12/2012 1315   CREATININE 0.51 (L) 06/23/2022 1134   CREATININE 0.45 (L) 05/09/2021 1611      Component Value Date/Time   CALCIUM 9.2 06/23/2022 1134   CALCIUM 8.8 12/12/2012 1315   ALKPHOS 70 01/06/2021 1121   AST 14 05/09/2021 1611   ALT 16 05/09/2021 1611   BILITOT 0.5 05/09/2021 1611       A/P: 1. Medication review: patient currently on Taltz for psoriasis. Reviewed the medication with the patient, including the following: Altamease Oiler is a medication used to treat ankylosing spondylitis, plaque  psoriasis, and psoriatic arthritis. Subcutaneous: Allow to reach room temperature prior to injection (30 minutes). Do not shake. Inject full amount into the upper arms, thighs or any quadrant of the abdomen; administer each injection at a different anatomic location than a previous injection and avoid areas where the skin is tender, bruised, erythematous, indurated, or affected by psoriasis. Administration in the upper, outer arm may be performed by a caregiver or health care provider. Ixekizumab is intended for use under the guidance and supervision of a physician; may be self-injected by the patient following proper training in SubQ injection technique. Possible adverse effects include neutropenia, antibody development, increased risk of infection, injection site reaction, hypersensitivity reactions, and inflammatory bowel disease. Avoid live vaccinations. No recommendations for any changes at this time.  Butch Penny, PharmD, Patsy [place], CPP Clinical Pharmacist [facility] [phone]

## 2023-03-03 ENCOUNTER — Other Ambulatory Visit (HOSPITAL_COMMUNITY): Payer: Self-pay

## 2023-03-03 ENCOUNTER — Other Ambulatory Visit: Payer: Self-pay | Admitting: Pharmacy Technician

## 2023-03-03 NOTE — Progress Notes (Signed)
Specialty Pharmacy Refill Coordination Note  Rachael Weeks is a 62 y.o. female contacted today regarding refills of specialty medication(s) Ixekizumab   Patient requested Delivery   Delivery date: 03/10/23   Verified address: [address] [place], [place]   Medication will be filled on 03/09/23.

## 2023-03-08 ENCOUNTER — Other Ambulatory Visit (HOSPITAL_COMMUNITY): Payer: Self-pay

## 2023-03-09 ENCOUNTER — Other Ambulatory Visit: Payer: Self-pay

## 2023-03-16 ENCOUNTER — Other Ambulatory Visit: Payer: Self-pay | Admitting: Family

## 2023-03-16 DIAGNOSIS — Z1231 Encounter for screening mammogram for malignant neoplasm of breast: Secondary | ICD-10-CM

## 2023-03-22 DIAGNOSIS — Z7960 Long term (current) use of unspecified immunomodulators and immunosuppressants: Secondary | ICD-10-CM | POA: Diagnosis not present

## 2023-03-22 DIAGNOSIS — H903 Sensorineural hearing loss, bilateral: Secondary | ICD-10-CM | POA: Diagnosis not present

## 2023-03-22 DIAGNOSIS — L400 Psoriasis vulgaris: Secondary | ICD-10-CM | POA: Diagnosis not present

## 2023-03-22 DIAGNOSIS — L4050 Arthropathic psoriasis, unspecified: Secondary | ICD-10-CM | POA: Diagnosis not present

## 2023-03-25 ENCOUNTER — Ambulatory Visit
Admission: RE | Admit: 2023-03-25 | Discharge: 2023-03-25 | Disposition: A | Discharge status: Home / Self Care | Payer: Commercial Managed Care - PPO | Source: Ambulatory Visit | Attending: Family | Admitting: Family

## 2023-03-25 DIAGNOSIS — Z1231 Encounter for screening mammogram for malignant neoplasm of breast: Secondary | ICD-10-CM | POA: Insufficient documentation

## 2023-03-31 ENCOUNTER — Other Ambulatory Visit (HOSPITAL_COMMUNITY): Payer: Self-pay

## 2023-03-31 ENCOUNTER — Other Ambulatory Visit: Payer: Self-pay

## 2023-03-31 ENCOUNTER — Other Ambulatory Visit (HOSPITAL_COMMUNITY): Payer: Self-pay | Admitting: Pharmacy Technician

## 2023-03-31 NOTE — Progress Notes (Signed)
Specialty Pharmacy Refill Coordination Note  Rachael Weeks is a 62 y.o. female contacted today regarding refills of specialty medication(s) Ixekizumab   Patient requested Delivery   Delivery date: 04/08/23   Verified address: [address]  [place]   Medication will be filled on 04/07/23.

## 2023-04-05 DIAGNOSIS — H5203 Hypermetropia, bilateral: Secondary | ICD-10-CM | POA: Diagnosis not present

## 2023-04-05 DIAGNOSIS — E113413 Type 2 diabetes mellitus with severe nonproliferative diabetic retinopathy with macular edema, bilateral: Secondary | ICD-10-CM | POA: Diagnosis not present

## 2023-04-05 DIAGNOSIS — H524 Presbyopia: Secondary | ICD-10-CM | POA: Diagnosis not present

## 2023-04-05 DIAGNOSIS — H52223 Regular astigmatism, bilateral: Secondary | ICD-10-CM | POA: Diagnosis not present

## 2023-04-05 DIAGNOSIS — Z961 Presence of intraocular lens: Secondary | ICD-10-CM | POA: Diagnosis not present

## 2023-04-05 LAB — HM DIABETES EYE EXAM

## 2023-04-07 ENCOUNTER — Other Ambulatory Visit: Payer: Self-pay

## 2023-04-22 ENCOUNTER — Ambulatory Visit: Payer: Commercial Managed Care - PPO | Admitting: Family

## 2023-04-22 ENCOUNTER — Other Ambulatory Visit (HOSPITAL_COMMUNITY): Payer: Self-pay

## 2023-04-22 ENCOUNTER — Encounter (HOSPITAL_COMMUNITY): Payer: Self-pay

## 2023-04-22 NOTE — Progress Notes (Signed)
Specialty Pharmacy Refill Coordination Note  Rachael Weeks is a 62 y.o. female contacted today regarding refills of specialty medication(s) Ixekizumab   Patient requested No data recorded  Delivery date: 05/11/23   Verified address: [address] [place] [address]   Medication will be filled on 05/10/23.

## 2023-04-22 NOTE — Progress Notes (Signed)
Specialty Pharmacy Ongoing Clinical Assessment Note  Rachael Weeks is a 62 y.o. female who is being followed by the specialty pharmacy service for RxSp Psoriatic Arthritis   Patient's specialty medication(s) reviewed today: Ixekizumab   Missed doses in the last 4 weeks: 0   Patient/Caregiver did not have any additional questions or concerns.   Therapeutic benefit summary: Patient is achieving benefit   Adverse events/side effects summary: No adverse events/side effects   Patient's therapy is appropriate to: Continue    Goals Addressed             This Visit's Progress    Minimize recurrence of flares       Patient is on track. Patient will maintain adherence.  Patient reports that she is well-controlled on therapy.          Follow up:  6 months  Servando Snare Specialty Pharmacist

## 2023-05-03 ENCOUNTER — Ambulatory Visit: Payer: Commercial Managed Care - PPO | Admitting: Family

## 2023-05-03 ENCOUNTER — Encounter: Payer: Self-pay | Admitting: Family

## 2023-05-03 ENCOUNTER — Other Ambulatory Visit: Payer: Self-pay

## 2023-05-03 VITALS — BP 120/70 | HR 82 | Temp 98.1°F | Ht 60.0 in | Wt 124.4 lb

## 2023-05-03 DIAGNOSIS — R809 Proteinuria, unspecified: Secondary | ICD-10-CM | POA: Diagnosis not present

## 2023-05-03 DIAGNOSIS — Z7985 Long-term (current) use of injectable non-insulin antidiabetic drugs: Secondary | ICD-10-CM | POA: Diagnosis not present

## 2023-05-03 DIAGNOSIS — E119 Type 2 diabetes mellitus without complications: Secondary | ICD-10-CM

## 2023-05-03 DIAGNOSIS — Z8639 Personal history of other endocrine, nutritional and metabolic disease: Secondary | ICD-10-CM | POA: Diagnosis not present

## 2023-05-03 DIAGNOSIS — E1129 Type 2 diabetes mellitus with other diabetic kidney complication: Secondary | ICD-10-CM

## 2023-05-03 LAB — POCT GLYCOSYLATED HEMOGLOBIN (HGB A1C): Hemoglobin A1C: 7.1 % — AB (ref 4.0–5.6)

## 2023-05-03 MED ORDER — OZEMPIC (2 MG/DOSE) 8 MG/3ML ~~LOC~~ SOPN
2.0000 mg | PEN_INJECTOR | SUBCUTANEOUS | 3 refills | Status: DC
  Filled 2023-05-03: qty 3, 28d supply, fill #0

## 2023-05-03 NOTE — Progress Notes (Unsigned)
Assessment & Plan:  Type 2 diabetes mellitus with diabetic microalbuminuria, without long-term current use of insulin (HCC) -     POCT glycosylated hemoglobin (Hb A1C)  Diabetes mellitus without complication (HCC)     Return precautions given.   Risks, benefits, and alternatives of the medications and treatment plan prescribed today were discussed, and patient expressed understanding.   Education regarding symptom management and diagnosis given to patient on AVS either electronically or printed.  No follow-ups on file.  [name], FNP  Subjective:    Patient ID: Rachael Weeks, female    DOB: 05-25-1961, 62 y.o.   MRN: [id number]  CC: Rachael Weeks is a 62 y.o. female who presents today for follow up.   HPI: She has started walking.      Allergies: Patient has no known allergies. Current Outpatient Medications on File Prior to Visit  Medication Sig Dispense Refill   Blood Glucose Monitoring Suppl (FREESTYLE FREEDOM LITE) w/Device KIT 1 each by Does not apply route daily. 1 kit 0   clobetasol ointment (TEMOVATE) 0.05 % Apply 1 Application topically 2 (two) times daily. 30 g 5   cyanocobalamin (VITAMIN B12) 1000 MCG/ML injection 1000 mcg (1 mL) intramuscular injection in the thigh ( vastus lateralis) once per month. 3 mL 4   folic acid (FOLVITE) 1 MG tablet Take 1 tablet (1 mg total) by mouth daily. 90 tablet 3   glucose blood (FREESTYLE LITE) test strip USED TO CHECK BLOOD GLUCOSE 2 TIMES A DAY. 100 each 5   ixekizumab (TALTZ) 80 MG/ML pen Inject 80 mg into the skin as directed. 3 mL 3   Lancet Devices (B-D LANCET DEVICE) MISC Inject 1 application into the skin 4 (four) times daily. 100 each 3   Lancets (FREESTYLE) lancets USED TO CHECK BLOOD GLUCOSE LEVELS 2 TIMES A DAY. 100 each 5   methotrexate (RHEUMATREX) 2.5 MG tablet Take 8 tablets (20 mg total) by mouth every 7 (seven) days. All on the same day. 32 tablet 5   NEEDLE, DISP, 25 G (BD ECLIPSE NEEDLE) 25G X 1"  MISC Used to give monthly B-12 injections. 50 each 1   Semaglutide, 2 MG/DOSE, (OZEMPIC, 2 MG/DOSE,) 8 MG/3ML SOPN Inject 2 mg as directed once a week. 3 mL 3   SYRINGE/NEEDLE, DISP, 1 ML (BD LUER-LOK SYRINGE) 25G X 5/8" 1 ML MISC Used to give monthly B-12 injections. 50 each 1   tobramycin (TOBREX) 0.3 % ophthalmic solution Instill 1 drop in both eyes four times a day; Begin one day prior to procedure, use day of procedure, one day after then stop 5 mL 5   tobramycin (TOBREX) 0.3 % ophthalmic solution Instill 1 drop into both eyes four times a day; Begin one day prior to procedure, use day of procedure, one day after then stop 5 mL 2   No current facility-administered medications on file prior to visit.    Review of Systems    Objective:    BP 120/70   Pulse 82   Temp 98.1 F (36.7 C) (Oral)   Ht 5' (1.524 m)   Wt 124 lb 6.4 oz (56.4 kg)   SpO2 98%   BMI 24.30 kg/m  BP Readings from Last 3 Encounters:  05/03/23 120/70  01/15/23 127/68  10/13/22 130/70   Wt Readings from Last 3 Encounters:  05/03/23 124 lb 6.4 oz (56.4 kg)  01/15/23 121 lb 12.8 oz (55.2 kg)  10/13/22 122 lb (55.3 kg)    Physical  Exam

## 2023-05-04 NOTE — Assessment & Plan Note (Addendum)
Lab Results  Component Value Date   HGBA1C 7.1 (A) 05/03/2023    Chronic, stable.  Patient declines resuming metformin 500mg  qd at this time.  She prefers to work with dietary and lifestyle modifications.  Continue Ozempic 2 mg.  Diminished bilateral pedal pulses on exam, pending ABI.  follow-up in 3 months.

## 2023-05-05 ENCOUNTER — Telehealth: Payer: Self-pay | Admitting: Family

## 2023-05-05 NOTE — Telephone Encounter (Signed)
No vm set up . thanks

## 2023-05-10 ENCOUNTER — Other Ambulatory Visit: Payer: Self-pay

## 2023-05-11 ENCOUNTER — Ambulatory Visit
Admission: RE | Admit: 2023-05-11 | Discharge: 2023-05-11 | Disposition: A | Discharge status: Home / Self Care | Payer: Commercial Managed Care - PPO | Source: Ambulatory Visit | Attending: Family | Admitting: Family

## 2023-05-11 DIAGNOSIS — E119 Type 2 diabetes mellitus without complications: Secondary | ICD-10-CM | POA: Insufficient documentation

## 2023-05-11 DIAGNOSIS — I739 Peripheral vascular disease, unspecified: Secondary | ICD-10-CM | POA: Diagnosis not present

## 2023-05-12 DIAGNOSIS — E113513 Type 2 diabetes mellitus with proliferative diabetic retinopathy with macular edema, bilateral: Secondary | ICD-10-CM | POA: Diagnosis not present

## 2023-05-12 DIAGNOSIS — H43823 Vitreomacular adhesion, bilateral: Secondary | ICD-10-CM | POA: Diagnosis not present

## 2023-05-12 DIAGNOSIS — H31001 Unspecified chorioretinal scars, right eye: Secondary | ICD-10-CM | POA: Diagnosis not present

## 2023-05-24 ENCOUNTER — Other Ambulatory Visit (INDEPENDENT_AMBULATORY_CARE_PROVIDER_SITE_OTHER): Payer: Commercial Managed Care - PPO

## 2023-05-24 DIAGNOSIS — Z8639 Personal history of other endocrine, nutritional and metabolic disease: Secondary | ICD-10-CM

## 2023-05-24 DIAGNOSIS — E119 Type 2 diabetes mellitus without complications: Secondary | ICD-10-CM | POA: Diagnosis not present

## 2023-05-24 LAB — LIPID PANEL
Cholesterol: 99 mg/dL (ref 0–200)
HDL: 48.9 mg/dL (ref >39.00)
LDL Cholesterol: 34 mg/dL (ref 0–99)
NonHDL: 50.48
Total CHOL/HDL Ratio: 2
Triglycerides: 81 mg/dL (ref 0.0–149.0)
VLDL: 16.2 mg/dL (ref 0.0–40.0)

## 2023-05-24 LAB — VITAMIN D 25 HYDROXY (VIT D DEFICIENCY, FRACTURES): VITD: 14.79 ng/mL — ABNORMAL LOW (ref 30.00–100.00)

## 2023-05-24 LAB — MICROALBUMIN / CREATININE URINE RATIO
Creatinine,U: 77.6 mg/dL
Microalb Creat Ratio: 1.9 mg/g (ref 0.0–30.0)
Microalb, Ur: 1.4 mg/dL (ref 0.0–1.9)

## 2023-05-31 ENCOUNTER — Other Ambulatory Visit: Payer: Self-pay

## 2023-05-31 ENCOUNTER — Other Ambulatory Visit: Payer: Self-pay | Admitting: Family

## 2023-05-31 DIAGNOSIS — E559 Vitamin D deficiency, unspecified: Secondary | ICD-10-CM

## 2023-05-31 MED ORDER — CHOLECALCIFEROL 1.25 MG (50000 UT) PO CAPS
50000.0000 [IU] | ORAL_CAPSULE | ORAL | 0 refills | Status: DC
  Filled 2023-05-31: qty 8, 56d supply, fill #0

## 2023-06-01 ENCOUNTER — Other Ambulatory Visit: Payer: Self-pay

## 2023-06-01 NOTE — Progress Notes (Signed)
 Specialty Pharmacy Refill Coordination Note  Rachael Weeks is a 62 y.o. female contacted today regarding refills of specialty medication(s) Ixekizumab  (Taltz )   Patient requested Delivery   Delivery date: 06/04/23   Verified address: [address]   [place] [address]   Medication will be filled on 06/03/23.

## 2023-06-03 ENCOUNTER — Other Ambulatory Visit: Payer: Self-pay

## 2023-06-03 NOTE — Progress Notes (Signed)
 06/03/23: Rachael Weeks  Will need payment information for new copay. Called twice but could not hear patient/patient could not hear me. Left patient a MyChart message to call back. Will charge to AR account if no call back by end of day.

## 2023-06-17 ENCOUNTER — Other Ambulatory Visit (HOSPITAL_COMMUNITY): Payer: Self-pay

## 2023-06-24 ENCOUNTER — Ambulatory Visit: Payer: Commercial Managed Care - PPO | Admitting: Internal Medicine

## 2023-06-29 ENCOUNTER — Other Ambulatory Visit (HOSPITAL_COMMUNITY): Payer: Self-pay | Admitting: Pharmacy Technician

## 2023-06-29 ENCOUNTER — Other Ambulatory Visit (HOSPITAL_COMMUNITY): Payer: Self-pay

## 2023-06-29 NOTE — Progress Notes (Signed)
Specialty Pharmacy Refill Coordination Note  Rachael Weeks is a 63 y.o. female contacted today regarding refills of specialty medication(s) Ixekizumab Altamease Oiler)   Patient requested Delivery   Delivery date: 07/08/23   Verified address: [address] [place]   Medication will be filled on 07/07/23.

## 2023-07-07 ENCOUNTER — Other Ambulatory Visit: Payer: Self-pay

## 2023-07-23 DIAGNOSIS — L4050 Arthropathic psoriasis, unspecified: Secondary | ICD-10-CM | POA: Diagnosis not present

## 2023-07-23 DIAGNOSIS — L400 Psoriasis vulgaris: Secondary | ICD-10-CM | POA: Diagnosis not present

## 2023-07-23 DIAGNOSIS — Z7960 Long term (current) use of unspecified immunomodulators and immunosuppressants: Secondary | ICD-10-CM | POA: Diagnosis not present

## 2023-07-29 ENCOUNTER — Other Ambulatory Visit: Payer: Self-pay

## 2023-07-29 NOTE — Progress Notes (Signed)
 Specialty Pharmacy Refill Coordination Note  Rachael Weeks is a 63 y.o. female contacted today regarding refills of specialty medication(s) Ixekizumab Altamease Oiler)   Patient requested Delivery   Delivery date: 08/04/23   Verified address: [address] [place]   Medication will be filled on 08/03/23.

## 2023-08-02 ENCOUNTER — Ambulatory Visit: Payer: Commercial Managed Care - PPO | Admitting: Family

## 2023-08-03 ENCOUNTER — Other Ambulatory Visit: Payer: Self-pay

## 2023-08-17 ENCOUNTER — Other Ambulatory Visit (HOSPITAL_COMMUNITY): Payer: Self-pay

## 2023-08-19 ENCOUNTER — Other Ambulatory Visit: Payer: Self-pay

## 2023-08-26 ENCOUNTER — Other Ambulatory Visit: Payer: Self-pay

## 2023-08-26 ENCOUNTER — Encounter: Payer: Self-pay | Admitting: Family

## 2023-08-26 ENCOUNTER — Ambulatory Visit: Payer: Commercial Managed Care - PPO | Admitting: Family

## 2023-08-26 VITALS — BP 122/76 | HR 93 | Temp 97.8°F | Ht 60.0 in | Wt 121.6 lb

## 2023-08-26 DIAGNOSIS — R7309 Other abnormal glucose: Secondary | ICD-10-CM

## 2023-08-26 DIAGNOSIS — E1129 Type 2 diabetes mellitus with other diabetic kidney complication: Secondary | ICD-10-CM

## 2023-08-26 DIAGNOSIS — Z7984 Long term (current) use of oral hypoglycemic drugs: Secondary | ICD-10-CM | POA: Diagnosis not present

## 2023-08-26 DIAGNOSIS — R809 Proteinuria, unspecified: Secondary | ICD-10-CM

## 2023-08-26 DIAGNOSIS — E538 Deficiency of other specified B group vitamins: Secondary | ICD-10-CM

## 2023-08-26 DIAGNOSIS — E119 Type 2 diabetes mellitus without complications: Secondary | ICD-10-CM

## 2023-08-26 LAB — POCT GLYCOSYLATED HEMOGLOBIN (HGB A1C): Hemoglobin A1C: 7.6 % — AB (ref 4.0–5.6)

## 2023-08-26 MED ORDER — EMPAGLIFLOZIN 10 MG PO TABS
10.0000 mg | ORAL_TABLET | Freq: Every day | ORAL | 2 refills | Status: DC
  Filled 2023-08-26: qty 30, 30d supply, fill #0
  Filled 2023-10-01: qty 30, 30d supply, fill #1
  Filled 2023-11-15: qty 30, 30d supply, fill #2

## 2023-08-26 MED ORDER — CYANOCOBALAMIN 1000 MCG/ML IJ SOLN
1000.0000 ug | INTRAMUSCULAR | 4 refills | Status: AC
  Filled 2023-08-26: qty 3, 90d supply, fill #0
  Filled 2023-11-24: qty 3, 90d supply, fill #1
  Filled 2024-04-04: qty 3, 90d supply, fill #2
  Filled 2024-06-29: qty 3, 90d supply, fill #3

## 2023-08-26 NOTE — Progress Notes (Unsigned)
 Assessment & Plan:  Type 2 diabetes mellitus with diabetic microalbuminuria, without long-term current use of insulin (HCC) Assessment & Plan: Chronic, uncontrolled.  Decrease Ozempic to 1 mg.  Start Jardiance 10 mg.  Consider glipizide and further weaning of Ozempic.  Concerned with weight loss, nausea on Ozempic.  Close follow-up  Orders: -     Empagliflozin; Take 1 tablet (10 mg total) by mouth daily before breakfast.  Dispense: 30 tablet; Refill: 2  Elevated glucose -     POCT glycosylated hemoglobin (Hb A1C) -     Microalbumin / creatinine urine ratio  B12 deficiency -     Cyanocobalamin; Inject 1 mL (1,000 mcg total) into the muscle of the thigh every 30 (thirty) days.  Dispense: 3 mL; Refill: 4  Diabetes mellitus without complication (HCC) -     Ozempic (2 MG/DOSE); Inject 1 mg as directed once a week.     Return precautions given.   Risks, benefits, and alternatives of the medications and treatment plan prescribed today were discussed, and patient expressed understanding.   Education regarding symptom management and diagnosis given to patient on AVS either electronically or printed.  No follow-ups on file.  [name], FNP  Subjective:    Patient ID: Rachael Weeks, female    DOB: Sep 30, 1960, 63 y.o.   MRN: [id number]  CC: Rachael Weeks is a 63 y.o. female who presents today for follow up.   HPI: Overall feels well today.  No new complaints.  Endorses feelings of stress at home.  Daughter-in-law, granddaughter had moved in with her.  She endorses nausea recently on Ozempic 2mg .  No v, constipation, or weight loss.  She notes previously she has been on glipizide and done well.    She remains compliant with monthly B12 injection at home.  Requests refill Allergies: Patient has no known allergies. Current Outpatient Medications on File Prior to Visit  Medication Sig Dispense Refill   Blood Glucose Monitoring Suppl (FREESTYLE FREEDOM LITE) w/Device KIT 1 each  by Does not apply route daily. 1 kit 0   Cholecalciferol 1.25 MG (50000 UT) capsule Take 1 capsule (50,000 Units total) by mouth once a week. 8 capsule 0   clobetasol ointment (TEMOVATE) 0.05 % Apply 1 Application topically 2 (two) times daily. 30 g 5   folic acid (FOLVITE) 1 MG tablet Take 1 tablet (1 mg total) by mouth daily. 90 tablet 3   glucose blood (FREESTYLE LITE) test strip USED TO CHECK BLOOD GLUCOSE 2 TIMES A DAY. 100 each 5   ixekizumab (TALTZ) 80 MG/ML pen Inject 80 mg into the skin as directed. 3 mL 3   Lancet Devices (B-D LANCET DEVICE) MISC Inject 1 application into the skin 4 (four) times daily. 100 each 3   Lancets (FREESTYLE) lancets USED TO CHECK BLOOD GLUCOSE LEVELS 2 TIMES A DAY. 100 each 5   methotrexate (RHEUMATREX) 2.5 MG tablet Take 8 tablets (20 mg total) by mouth every 7 (seven) days. All on the same day. 32 tablet 5   NEEDLE, DISP, 25 G (BD ECLIPSE NEEDLE) 25G X 1" MISC Used to give monthly B-12 injections. 50 each 1   SYRINGE/NEEDLE, DISP, 1 ML (BD LUER-LOK SYRINGE) 25G X 5/8" 1 ML MISC Used to give monthly B-12 injections. 50 each 1   tobramycin (TOBREX) 0.3 % ophthalmic solution Instill 1 drop in both eyes four times a day; Begin one day prior to procedure, use day of procedure, one day after then stop 5  mL 5   tobramycin (TOBREX) 0.3 % ophthalmic solution Instill 1 drop into both eyes four times a day; Begin one day prior to procedure, use day of procedure, one day after then stop 5 mL 2   No current facility-administered medications on file prior to visit.    Review of Systems  Constitutional:  Negative for chills and fever.  Respiratory:  Negative for cough.   Cardiovascular:  Negative for chest pain and palpitations.  Gastrointestinal:  Positive for nausea. Negative for constipation, diarrhea and vomiting.      Objective:    BP 122/76   Pulse 93   Temp 97.8 F (36.6 C) (Oral)   Ht 5' (1.524 m)   Wt 121 lb 9.6 oz (55.2 kg)   SpO2 98%   BMI 23.75  kg/m  BP Readings from Last 3 Encounters:  08/26/23 122/76  05/03/23 120/70  01/15/23 127/68   Wt Readings from Last 3 Encounters:  08/26/23 121 lb 9.6 oz (55.2 kg)  05/03/23 124 lb 6.4 oz (56.4 kg)  01/15/23 121 lb 12.8 oz (55.2 kg)    Physical Exam Vitals reviewed.  Constitutional:      Appearance: She is well-developed.  Eyes:     Conjunctiva/sclera: Conjunctivae normal.  Cardiovascular:     Rate and Rhythm: Normal rate and regular rhythm.     Pulses: Normal pulses.     Heart sounds: Normal heart sounds.  Pulmonary:     Effort: Pulmonary effort is normal.     Breath sounds: Normal breath sounds. No wheezing, rhonchi or rales.  Skin:    General: Skin is warm and dry.  Neurological:     Mental Status: She is alert.  Psychiatric:        Speech: Speech normal.        Behavior: Behavior normal.        Thought Content: Thought content normal.

## 2023-08-26 NOTE — Patient Instructions (Addendum)
 Start jardiance 10mg   Decrease ozempic to 1mg    Monitor nausea  We may discontinue ozempic all together.

## 2023-08-26 NOTE — Progress Notes (Signed)
 Specialty Pharmacy Refill Coordination Note  Rachael Weeks is a 63 y.o. female contacted today regarding refills of specialty medication(s) Ixekizumab Altamease Oiler)   Patient requested (Patient-Rptd) Delivery   Delivery date: (Patient-Rptd) 09/07/23   Verified address: (Patient-Rptd) [address] [name] [address]   Medication will be filled on 09/06/23.

## 2023-08-27 LAB — MICROALBUMIN / CREATININE URINE RATIO
Creatinine,U: 25.5 mg/dL
Microalb Creat Ratio: UNDETERMINED mg/g (ref 0.0–30.0)
Microalb, Ur: <0.7 mg/dL

## 2023-08-27 MED ORDER — OZEMPIC (2 MG/DOSE) 8 MG/3ML ~~LOC~~ SOPN: 1.0000 mg | PEN_INJECTOR | SUBCUTANEOUS | Status: DC

## 2023-08-27 NOTE — Assessment & Plan Note (Signed)
 Chronic, uncontrolled.  Decrease Ozempic to 1 mg.  Start Jardiance 10 mg.  Consider glipizide and further weaning of Ozempic.  Concerned with weight loss, nausea on Ozempic.  Close follow-up

## 2023-08-30 ENCOUNTER — Other Ambulatory Visit: Payer: Self-pay

## 2023-08-31 ENCOUNTER — Other Ambulatory Visit: Payer: Self-pay

## 2023-08-31 ENCOUNTER — Encounter: Payer: Self-pay | Admitting: Family

## 2023-08-31 MED ORDER — METHOTREXATE SODIUM 2.5 MG PO TABS
20.0000 mg | ORAL_TABLET | ORAL | 5 refills | Status: AC
  Filled 2023-08-31: qty 32, 28d supply, fill #0
  Filled 2023-10-01: qty 32, 28d supply, fill #1
  Filled 2023-11-15: qty 32, 28d supply, fill #2
  Filled 2024-01-03: qty 32, 28d supply, fill #3
  Filled 2024-04-04: qty 32, 28d supply, fill #4
  Filled 2024-06-29: qty 32, 28d supply, fill #5

## 2023-09-01 ENCOUNTER — Other Ambulatory Visit: Payer: Self-pay

## 2023-09-06 ENCOUNTER — Other Ambulatory Visit: Payer: Self-pay

## 2023-09-06 NOTE — Progress Notes (Signed)
 Pharmacy Patient Advocate Encounter   Received notification from Patient Pharmacy that prior authorization for Altamease Oiler is required/requested.   Insurance verification completed.   The patient is insured through [facility] .   Per test claim: PA required; PA submitted to above mentioned insurance via CoverMyMeds Key/confirmation #/EOC [id number] Status is pending

## 2023-09-06 NOTE — Progress Notes (Signed)
 PA submitted.

## 2023-09-07 ENCOUNTER — Other Ambulatory Visit: Payer: Self-pay

## 2023-09-07 NOTE — Progress Notes (Signed)
 Pharmacy Patient Advocate Encounter  Received notification from [facility] that Prior Authorization for Altamease Oiler has been APPROVED from 09/06/23 to 09/04/24   PA #/Case ID/Reference #:  [id number]

## 2023-09-28 ENCOUNTER — Other Ambulatory Visit: Payer: Self-pay

## 2023-09-28 ENCOUNTER — Other Ambulatory Visit (HOSPITAL_COMMUNITY): Payer: Self-pay

## 2023-09-28 ENCOUNTER — Other Ambulatory Visit: Payer: Self-pay | Admitting: Pharmacy Technician

## 2023-09-28 NOTE — Progress Notes (Signed)
 Specialty Pharmacy Refill Coordination Note  Rachael Weeks is a 63 y.o. female contacted today regarding refills of specialty medication(s) Ixekizumab  (Taltz )   Patient requested Delivery   Delivery date: 10/05/23   Verified address: [address], [place] [address]   Medication will be filled on 10/04/23.

## 2023-09-28 NOTE — Progress Notes (Signed)
 Clinical Intervention Note  Clinical Intervention Notes: Patient reports starting on Jardiance , no DDIs were identified.   Clinical Intervention Outcomes: Prevention of an adverse drug event   Rachael Weeks

## 2023-10-01 ENCOUNTER — Other Ambulatory Visit: Payer: Self-pay

## 2023-10-04 ENCOUNTER — Other Ambulatory Visit: Payer: Self-pay

## 2023-10-07 ENCOUNTER — Other Ambulatory Visit: Payer: Self-pay | Admitting: Acute Care

## 2023-10-07 ENCOUNTER — Encounter: Payer: Self-pay | Admitting: Acute Care

## 2023-10-07 DIAGNOSIS — Z122 Encounter for screening for malignant neoplasm of respiratory organs: Secondary | ICD-10-CM

## 2023-10-07 DIAGNOSIS — Z87891 Personal history of nicotine dependence: Secondary | ICD-10-CM

## 2023-10-12 ENCOUNTER — Telehealth: Payer: Self-pay

## 2023-10-12 ENCOUNTER — Other Ambulatory Visit: Payer: Self-pay

## 2023-10-12 ENCOUNTER — Ambulatory Visit: Admitting: Family

## 2023-10-12 ENCOUNTER — Encounter: Payer: Self-pay | Admitting: Family

## 2023-10-12 VITALS — BP 122/80 | HR 70 | Temp 97.9°F | Ht 60.0 in | Wt 125.6 lb

## 2023-10-12 DIAGNOSIS — E1129 Type 2 diabetes mellitus with other diabetic kidney complication: Secondary | ICD-10-CM | POA: Diagnosis not present

## 2023-10-12 DIAGNOSIS — R809 Proteinuria, unspecified: Secondary | ICD-10-CM | POA: Diagnosis not present

## 2023-10-12 DIAGNOSIS — Z01818 Encounter for other preprocedural examination: Secondary | ICD-10-CM | POA: Diagnosis not present

## 2023-10-12 DIAGNOSIS — Z7984 Long term (current) use of oral hypoglycemic drugs: Secondary | ICD-10-CM | POA: Diagnosis not present

## 2023-10-12 LAB — CBC WITH DIFFERENTIAL/PLATELET
Basophils Absolute: 0 10*3/uL (ref 0.0–0.1)
Basophils Relative: 0.6 % (ref 0.0–3.0)
Eosinophils Absolute: 0.2 10*3/uL (ref 0.0–0.7)
Eosinophils Relative: 4.7 % (ref 0.0–5.0)
HCT: 40.5 % (ref 36.0–46.0)
Hemoglobin: 13.9 g/dL (ref 12.0–15.0)
Lymphocytes Relative: 28.2 % (ref 12.0–46.0)
Lymphs Abs: 1.2 10*3/uL (ref 0.7–4.0)
MCHC: 34.2 g/dL (ref 30.0–36.0)
MCV: 91.3 fl (ref 78.0–100.0)
Monocytes Absolute: 0.4 10*3/uL (ref 0.1–1.0)
Monocytes Relative: 10.4 % (ref 3.0–12.0)
Neutro Abs: 2.3 10*3/uL (ref 1.4–7.7)
Neutrophils Relative %: 56.1 % (ref 43.0–77.0)
Platelets: 188 10*3/uL (ref 150.0–400.0)
RBC: 4.44 Mil/uL (ref 3.87–5.11)
RDW: 12.7 % (ref 11.5–15.5)
WBC: 4.2 10*3/uL (ref 4.0–10.5)

## 2023-10-12 LAB — COMPREHENSIVE METABOLIC PANEL WITH GFR
ALT: 18 U/L (ref 0–35)
AST: 17 U/L (ref 0–37)
Albumin: 4 g/dL (ref 3.5–5.2)
Alkaline Phosphatase: 56 U/L (ref 39–117)
BUN: 9 mg/dL (ref 6–23)
CO2: 30 meq/L (ref 19–32)
Calcium: 8.6 mg/dL (ref 8.4–10.5)
Chloride: 104 meq/L (ref 96–112)
Creatinine, Ser: 0.53 mg/dL (ref 0.40–1.20)
GFR: 99.12 mL/min (ref >60.00)
Glucose, Bld: 220 mg/dL — ABNORMAL HIGH (ref 70–99)
Potassium: 4 meq/L (ref 3.5–5.1)
Sodium: 138 meq/L (ref 135–145)
Total Bilirubin: 0.5 mg/dL (ref 0.2–1.2)
Total Protein: 6.4 g/dL (ref 6.0–8.3)

## 2023-10-12 MED ORDER — SEMAGLUTIDE (1 MG/DOSE) 4 MG/3ML ~~LOC~~ SOPN
1.0000 mg | PEN_INJECTOR | SUBCUTANEOUS | 2 refills | Status: DC
  Filled 2023-10-12: qty 3, 28d supply, fill #0
  Filled 2023-11-15: qty 3, 28d supply, fill #1

## 2023-10-12 NOTE — Assessment & Plan Note (Signed)
 FBG 150. She is not taking ozempic  1mg  and will pick up. Continue jardiance  10mg  qd

## 2023-10-12 NOTE — Progress Notes (Signed)
 Assessment & Plan:   Preoperative clearance -     EKG 12-Lead -     Comprehensive metabolic panel with GFR -     Semaglutide  (1 MG/DOSE); Inject 1 mg as directed once a week.  Dispense: 3 mL; Refill: 2 -     CBC with Differential/Platelet  Preop examination Assessment & Plan: EKG with SR. When compared to prior  12/05/21, no significant changes.  H/o DM, atherosclerosis. Moderate surgical risk.   Discussed glycemic control as she approaches surgery due to risk of complications including healing, infection.  Pending cbc, cmp Advised scheduling NSAIDs OTC after surgery and that I am okay with oral surgeon providing percocet or vicodin in a brief interim after surgery if needed.    Type 2 diabetes mellitus with diabetic microalbuminuria, without long-term current use of insulin  (HCC) Assessment & Plan: FBG 150. She is not taking ozempic  1mg  and will pick up. Continue jardiance  10mg  qd      Return precautions given.   Risks, benefits, and alternatives of the medications and treatment plan prescribed today were discussed, and patient expressed understanding.   Education regarding symptom management and diagnosis given to patient on AVS either electronically or printed.  Return in about 2 months (around 12/12/2023).  Rachael Bossier, FNP  Subjective:    Patient ID: Rachael Weeks, female    DOB: 10-11-1960, 63 y.o.   MRN: [id number]  Rachael Weeks is a 63 y.o. female who presents today for follow up.   HPI: Here for preoperative testing prior to oral maxilllofacial and dental surgery with prosthetic reconstruction with [facility], 11/16/23   She has upper and lower dentures currently.  Denies CP, Sob  No h/o  complication from anesthesia     She has been on percocet and vicodin after surgery in the past.   FBG 150 She hasn't taken ozempic  1mg  as higher dose at pharmacy. Compliant with jardiance  10mg   Allergies: Patient has no known allergies. Current Outpatient  Medications on File Prior to Visit  Medication Sig Dispense Refill   Blood Glucose Monitoring Suppl (FREESTYLE FREEDOM LITE) w/Device KIT 1 each by Does not apply route daily. 1 kit 0   Cholecalciferol  1.25 MG (50000 UT) capsule Take 1 capsule (50,000 Units total) by mouth once a week. 8 capsule 0   clobetasol  ointment (TEMOVATE ) 0.05 % Apply 1 Application topically 2 (two) times daily. 30 g 5   cyanocobalamin  (VITAMIN B12) 1000 MCG/ML injection Inject 1 mL (1,000 mcg total) into the muscle of the thigh every 30 (thirty) days. 3 mL 4   empagliflozin  (JARDIANCE ) 10 MG TABS tablet Take 1 tablet (10 mg total) by mouth daily before breakfast. 30 tablet 2   folic acid  (FOLVITE ) 1 MG tablet Take 1 tablet (1 mg total) by mouth daily. 90 tablet 3   glucose blood (FREESTYLE LITE) test strip USED TO CHECK BLOOD GLUCOSE 2 TIMES A DAY. 100 each 5   ixekizumab  (TALTZ ) 80 MG/ML pen Inject 80 mg into the skin as directed. 3 mL 3   Lancet Devices (B-D LANCET DEVICE) MISC Inject 1 application into the skin 4 (four) times daily. 100 each 3   Lancets (FREESTYLE) lancets USED TO CHECK BLOOD GLUCOSE LEVELS 2 TIMES A DAY. 100 each 5   methotrexate  (RHEUMATREX) 2.5 MG tablet Take 8 tablets (20 mg total) by mouth every 7 (seven) days All on the same day 32 tablet 5   NEEDLE, DISP, 25 G (BD ECLIPSE NEEDLE) 25G X 1"  MISC Used to give monthly B-12 injections. 50 each 1   SYRINGE/NEEDLE, DISP, 1 ML (BD LUER-LOK SYRINGE) 25G X 5/8" 1 ML MISC Used to give monthly B-12 injections. 50 each 1   tobramycin  (TOBREX ) 0.3 % ophthalmic solution Instill 1 drop in both eyes four times a day; Begin one day prior to procedure, use day of procedure, one day after then stop 5 mL 5   tobramycin  (TOBREX ) 0.3 % ophthalmic solution Instill 1 drop into both eyes four times a day; Begin one day prior to procedure, use day of procedure, one day after then stop 5 mL 2   No current facility-administered medications on file prior to visit.     Review of Systems  Constitutional:  Negative for chills and fever.  Respiratory:  Negative for cough.   Cardiovascular:  Negative for chest pain and palpitations.  Gastrointestinal:  Negative for nausea and vomiting.      Objective:    BP 122/80   Pulse 70   Temp 97.9 F (36.6 C) (Oral)   Ht 5' (1.524 m)   Wt 125 lb 9.6 oz (57 kg)   SpO2 98%   BMI 24.53 kg/m  BP Readings from Last 3 Encounters:  10/12/23 122/80  08/26/23 122/76  05/03/23 120/70   Wt Readings from Last 3 Encounters:  10/12/23 125 lb 9.6 oz (57 kg)  08/26/23 121 lb 9.6 oz (55.2 kg)  05/03/23 124 lb 6.4 oz (56.4 kg)    Physical Exam Vitals reviewed.  Constitutional:      Appearance: She is well-developed.  Eyes:     Conjunctiva/sclera: Conjunctivae normal.  Cardiovascular:     Rate and Rhythm: Normal rate and regular rhythm.     Pulses: Normal pulses.     Heart sounds: Normal heart sounds.  Pulmonary:     Effort: Pulmonary effort is normal.     Breath sounds: Normal breath sounds. No wheezing, rhonchi or rales.  Skin:    General: Skin is warm and dry.  Neurological:     Mental Status: She is alert.  Psychiatric:        Speech: Speech normal.        Behavior: Behavior normal.        Thought Content: Thought content normal.

## 2023-10-12 NOTE — Assessment & Plan Note (Addendum)
 EKG with SR. When compared to prior  12/05/21, no significant changes.  H/o DM, atherosclerosis. Moderate surgical risk.   Discussed glycemic control as she approaches surgery due to risk of complications including healing, infection.  Pending cbc, cmp Advised scheduling NSAIDs OTC after surgery and that I am okay with oral surgeon providing percocet or vicodin in a brief interim after surgery if needed.

## 2023-10-12 NOTE — Telephone Encounter (Signed)
 Copied from CRM [id number]. Topic: General - Other >> Oct 12, 2023  3:37 PM [place] E wrote: Reason for CRM: Britta Candy from [facility] stated that she will need the patient's most recent office note, labs, and an EKG (if completed) faxed over to [phone] for surgical clearance.

## 2023-10-12 NOTE — Patient Instructions (Signed)
 You may schedule ibuprofen immediately after surgery  Will fax all to oral surgeon once we have labs.   Let me know if fasting blood sugar are greater than 150 as we prepare for surgery

## 2023-10-13 ENCOUNTER — Ambulatory Visit: Payer: Self-pay | Admitting: Family

## 2023-10-13 NOTE — Telephone Encounter (Signed)
 Paperwork has been faxed to [phone] to [facility] from [facility] the patient's most recent office note, labs, and an EKG

## 2023-10-19 ENCOUNTER — Ambulatory Visit
Admission: RE | Admit: 2023-10-19 | Discharge: 2023-10-19 | Disposition: A | Discharge status: Home / Self Care | Source: Ambulatory Visit | Attending: Family | Admitting: Family

## 2023-10-19 DIAGNOSIS — Z87891 Personal history of nicotine dependence: Secondary | ICD-10-CM | POA: Insufficient documentation

## 2023-10-19 DIAGNOSIS — Z122 Encounter for screening for malignant neoplasm of respiratory organs: Secondary | ICD-10-CM | POA: Diagnosis not present

## 2023-10-19 DIAGNOSIS — F17210 Nicotine dependence, cigarettes, uncomplicated: Secondary | ICD-10-CM | POA: Diagnosis not present

## 2023-10-21 ENCOUNTER — Other Ambulatory Visit: Payer: Self-pay

## 2023-10-27 ENCOUNTER — Other Ambulatory Visit: Payer: Self-pay

## 2023-10-27 ENCOUNTER — Other Ambulatory Visit: Payer: Self-pay | Admitting: Internal Medicine

## 2023-10-27 NOTE — Progress Notes (Signed)
 Specialty Pharmacy Ongoing Clinical Assessment Note  Rachael Weeks is a 63 y.o. female who is being followed by the specialty pharmacy service for RxSp Psoriatic Arthritis   Patient's specialty medication(s) reviewed today: Ixekizumab  (Taltz )   Missed doses in the last 4 weeks: 0   Patient/Caregiver did not have any additional questions or concerns.   Therapeutic benefit summary: Patient is achieving benefit   Adverse events/side effects summary: No adverse events/side effects   Patient's therapy is appropriate to: Continue    Goals Addressed             This Visit's Progress    Minimize recurrence of flares   On track    Patient is on track. Patient will maintain adherence.  Patient reports that she is well-controlled on therapy.          Follow up: 6 months  [facility]

## 2023-10-27 NOTE — Progress Notes (Signed)
 Specialty Pharmacy Refill Coordination Note  Rachael Weeks is a 63 y.o. female contacted today regarding refills of specialty medication(s) Ixekizumab  (Taltz )   Patient requested Delivery   Delivery date: 11/03/23   Verified address: [address], [place] [address]   Medication will be filled on 11/02/23. This fill date is pending response to refill request from provider. Patient is aware and if they have not received fill by intended date they must follow up with pharmacy.

## 2023-10-28 ENCOUNTER — Other Ambulatory Visit: Payer: Self-pay | Admitting: Pharmacist

## 2023-10-28 ENCOUNTER — Other Ambulatory Visit: Payer: Self-pay

## 2023-10-28 MED ORDER — TALTZ 80 MG/ML ~~LOC~~ SOAJ
80.0000 mg | SUBCUTANEOUS | 3 refills | Status: AC
  Filled 2023-10-28: qty 1, 28d supply, fill #0
  Filled 2023-12-02: qty 1, 28d supply, fill #1
  Filled 2024-01-05: qty 1, 28d supply, fill #2
  Filled 2024-02-01: qty 1, 28d supply, fill #3
  Filled 2024-02-28: qty 1, 28d supply, fill #4
  Filled 2024-03-24: qty 1, 28d supply, fill #5
  Filled 2024-04-19: qty 1, 28d supply, fill #6
  Filled 2024-05-23 – 2024-05-24 (×2): qty 1, 28d supply, fill #7
  Filled 2024-06-20 (×2): qty 1, 28d supply, fill #8

## 2023-10-28 MED ORDER — TALTZ 80 MG/ML ~~LOC~~ SOAJ: 80.0000 mg | SUBCUTANEOUS | 3 refills | Status: DC

## 2023-11-02 ENCOUNTER — Other Ambulatory Visit: Payer: Self-pay

## 2023-11-04 ENCOUNTER — Other Ambulatory Visit (HOSPITAL_COMMUNITY): Payer: Self-pay

## 2023-11-08 ENCOUNTER — Other Ambulatory Visit: Payer: Self-pay

## 2023-11-08 DIAGNOSIS — F17210 Nicotine dependence, cigarettes, uncomplicated: Secondary | ICD-10-CM

## 2023-11-08 DIAGNOSIS — Z87891 Personal history of nicotine dependence: Secondary | ICD-10-CM

## 2023-11-08 DIAGNOSIS — Z122 Encounter for screening for malignant neoplasm of respiratory organs: Secondary | ICD-10-CM

## 2023-11-15 ENCOUNTER — Other Ambulatory Visit: Payer: Self-pay

## 2023-11-15 MED ORDER — OXYCODONE HCL 5 MG PO TABS
5.0000 mg | ORAL_TABLET | Freq: Four times a day (QID) | ORAL | 0 refills | Status: DC
  Filled 2023-11-15: qty 5, 2d supply, fill #0

## 2023-11-15 MED ORDER — IBUPROFEN 800 MG PO TABS
800.0000 mg | ORAL_TABLET | Freq: Three times a day (TID) | ORAL | 1 refills | Status: DC
  Filled 2023-11-15: qty 40, 14d supply, fill #0
  Filled 2023-11-24 – 2023-11-26 (×2): qty 40, 14d supply, fill #1

## 2023-11-15 MED ORDER — CHLORHEXIDINE GLUCONATE 0.12 % MT SOLN
15.0000 mL | Freq: Two times a day (BID) | OROMUCOSAL | 0 refills | Status: DC
  Filled 2023-11-15: qty 473, 16d supply, fill #0

## 2023-11-15 MED ORDER — ACETAMINOPHEN 500 MG PO TABS
500.0000 mg | ORAL_TABLET | Freq: Three times a day (TID) | ORAL | 1 refills | Status: DC
  Filled 2023-11-15: qty 40, 14d supply, fill #0
  Filled 2023-11-24: qty 40, 14d supply, fill #1

## 2023-11-15 MED ORDER — AMOXICILLIN 500 MG PO CAPS
500.0000 mg | ORAL_CAPSULE | Freq: Three times a day (TID) | ORAL | 1 refills | Status: DC
  Filled 2023-11-15: qty 21, 7d supply, fill #0
  Filled 2023-11-24: qty 21, 7d supply, fill #1

## 2023-11-24 ENCOUNTER — Other Ambulatory Visit: Payer: Self-pay

## 2023-11-26 ENCOUNTER — Other Ambulatory Visit: Payer: Self-pay

## 2023-12-02 ENCOUNTER — Other Ambulatory Visit: Payer: Self-pay

## 2023-12-02 NOTE — Progress Notes (Signed)
 Specialty Pharmacy Refill Coordination Note  Rachael Weeks is a 63 y.o. female contacted today regarding refills of specialty medication(s) Ixekizumab  (Taltz )   Patient requested Delivery   Delivery date: 12/08/23   Verified address: [address], [place] [address]   Medication will be filled on 07.08.25.

## 2023-12-23 ENCOUNTER — Encounter: Payer: Self-pay | Admitting: Family

## 2023-12-23 ENCOUNTER — Ambulatory Visit: Admitting: Family

## 2023-12-23 ENCOUNTER — Other Ambulatory Visit: Payer: Self-pay

## 2023-12-23 VITALS — BP 132/62 | HR 61 | Temp 97.9°F | Ht 60.0 in | Wt 119.4 lb

## 2023-12-23 DIAGNOSIS — E1129 Type 2 diabetes mellitus with other diabetic kidney complication: Secondary | ICD-10-CM | POA: Diagnosis not present

## 2023-12-23 DIAGNOSIS — Z7985 Long-term (current) use of injectable non-insulin antidiabetic drugs: Secondary | ICD-10-CM | POA: Diagnosis not present

## 2023-12-23 DIAGNOSIS — Z7984 Long term (current) use of oral hypoglycemic drugs: Secondary | ICD-10-CM | POA: Diagnosis not present

## 2023-12-23 DIAGNOSIS — R809 Proteinuria, unspecified: Secondary | ICD-10-CM

## 2023-12-23 LAB — POCT GLYCOSYLATED HEMOGLOBIN (HGB A1C): Hemoglobin A1C: 8.2 % — AB (ref 4.0–5.6)

## 2023-12-23 MED ORDER — EMPAGLIFLOZIN 25 MG PO TABS
25.0000 mg | ORAL_TABLET | Freq: Every day | ORAL | 5 refills | Status: AC
  Filled 2023-12-23: qty 30, 30d supply, fill #0
  Filled 2024-04-04: qty 30, 30d supply, fill #1
  Filled 2024-06-29: qty 30, 30d supply, fill #2

## 2023-12-23 NOTE — Assessment & Plan Note (Signed)
 Chronic, uncontrolled. A1c goal 6.5. Increase jardiance  to 25mg . Continue ozempic  1mg .

## 2023-12-23 NOTE — Patient Instructions (Signed)
 Increase jardiance  to 25mg   Nice to see you!

## 2023-12-23 NOTE — Progress Notes (Signed)
 Assessment & Plan:  Type 2 diabetes mellitus with diabetic microalbuminuria, without long-term current use of insulin  [facility]) Assessment & Plan: Chronic, uncontrolled. A1c goal 6.5. Increase jardiance  to 25mg . Continue ozempic  1mg .   Orders: -     POCT glycosylated hemoglobin (Hb A1C) -     Empagliflozin ; Take 1 tablet (25 mg total) by mouth daily before breakfast.  Dispense: 30 tablet; Refill: 5     Return precautions given.   Risks, benefits, and alternatives of the medications and treatment plan prescribed today were discussed, and patient expressed understanding.   Education regarding symptom management and diagnosis given to patient on AVS either electronically or printed.  No follow-ups on file.  Rollene Northern, FNP  Subjective:    Patient ID: Montie VEAR Free, female    DOB: August 07, 1960, 64 y.o.   MRN: [id number]  CC: CAROLJEAN MONSIVAIS is a 63 y.o. female who presents today for follow up.   HPI: Feels well today No new complaints She had dental implant in the last  months and she has continued to eat soft foods d/t tenderness of gums. She endorses dietary indiscretion with carbohydrates.      Mammogram and CT lung cancer screen are UTD  Allergies: Patient has no known allergies. Current Outpatient Medications on File Prior to Visit  Medication Sig Dispense Refill   Blood Glucose Monitoring Suppl (FREESTYLE FREEDOM LITE) w/Device KIT 1 each by Does not apply route daily. 1 kit 0   clobetasol  ointment (TEMOVATE ) 0.05 % Apply 1 Application topically 2 (two) times daily. 30 g 5   cyanocobalamin  (VITAMIN B12) 1000 MCG/ML injection Inject 1 mL (1,000 mcg total) into the muscle of the thigh every 30 (thirty) days. 3 mL 4   folic acid  (FOLVITE ) 1 MG tablet Take 1 tablet (1 mg total) by mouth daily. 90 tablet 3   glucose blood (FREESTYLE LITE) test strip USED TO CHECK BLOOD GLUCOSE 2 TIMES A DAY. 100 each 5   ixekizumab  (TALTZ ) 80 MG/ML pen Inject 80 mg subcutaneously as directed  3 mL 3   Lancet Devices (B-D LANCET DEVICE) MISC Inject 1 application into the skin 4 (four) times daily. 100 each 3   Lancets (FREESTYLE) lancets USED TO CHECK BLOOD GLUCOSE LEVELS 2 TIMES A DAY. 100 each 5   methotrexate  (RHEUMATREX) 2.5 MG tablet Take 8 tablets (20 mg total) by mouth every 7 (seven) days All on the same day 32 tablet 5   NEEDLE, DISP, 25 G (BD ECLIPSE NEEDLE) 25G X 1 MISC Used to give monthly B-12 injections. 50 each 1   Semaglutide , 1 MG/DOSE, 4 MG/3ML SOPN Inject 1 mg as directed once a week. 3 mL 2   SYRINGE/NEEDLE, DISP, 1 ML (BD LUER-LOK SYRINGE) 25G X 5/8 1 ML MISC Used to give monthly B-12 injections. 50 each 1   tobramycin  (TOBREX ) 0.3 % ophthalmic solution Instill 1 drop in both eyes four times a day; Begin one day prior to procedure, use day of procedure, one day after then stop 5 mL 5   tobramycin  (TOBREX ) 0.3 % ophthalmic solution Instill 1 drop into both eyes four times a day; Begin one day prior to procedure, use day of procedure, one day after then stop 5 mL 2   acetaminophen  (TYLENOL ) 500 MG tablet Take 1 tablet (500 mg total) by mouth every 8 (eight) hours for post-surgical discomfort. (Patient not taking: Reported on 12/23/2023) 40 tablet 1   amoxicillin  (AMOXIL ) 500 MG capsule Take 1 capsule (500 mg  total) by mouth 3 (three) times daily. (Patient not taking: Reported on 12/23/2023) 21 capsule 1   chlorhexidine  (PERIDEX ) 0.12 % solution Swish 15ml for at least 1 minute two times per day. (Patient not taking: Reported on 12/23/2023) 473 mL 0   Cholecalciferol  1.25 MG (50000 UT) capsule Take 1 capsule (50,000 Units total) by mouth once a week. (Patient not taking: Reported on 12/23/2023) 8 capsule 0   ibuprofen  (ADVIL ) 800 MG tablet Take one tablet every 8 hours for post-surgical discomfort. Take in-between intake of Tyelenol. (Patient not taking: Reported on 12/23/2023) 40 tablet 1   oxyCODONE  (OXY IR/ROXICODONE ) 5 MG immediate release tablet Take 1 tablet (5 mg total)  by mouth every 6 (six) - 8 (eight) hours for pain after oral surgery. (Patient not taking: Reported on 12/23/2023) 5 tablet 0   No current facility-administered medications on file prior to visit.    Review of Systems  Constitutional:  Negative for chills and fever.  Respiratory:  Negative for cough.   Cardiovascular:  Negative for chest pain and palpitations.  Gastrointestinal:  Negative for nausea and vomiting.  Genitourinary:  Negative for dysuria.      Objective:    BP 132/62   Pulse 61   Temp 97.9 F (36.6 C)   Ht 5' (1.524 m)   Wt 119 lb 6.4 oz (54.2 kg)   SpO2 98%   BMI 23.32 kg/m  BP Readings from Last 3 Encounters:  12/23/23 132/62  10/12/23 122/80  08/26/23 122/76   Wt Readings from Last 3 Encounters:  12/23/23 119 lb 6.4 oz (54.2 kg)  10/12/23 125 lb 9.6 oz (57 kg)  08/26/23 121 lb 9.6 oz (55.2 kg)    Physical Exam Vitals reviewed.  Constitutional:      Appearance: She is well-developed.  Eyes:     Conjunctiva/sclera: Conjunctivae normal.  Neck:     Thyroid : No thyroid  mass or thyromegaly.  Cardiovascular:     Rate and Rhythm: Normal rate and regular rhythm.     Pulses: Normal pulses.     Heart sounds: Normal heart sounds.  Pulmonary:     Effort: Pulmonary effort is normal.     Breath sounds: Normal breath sounds. No wheezing, rhonchi or rales.  Lymphadenopathy:     Head:     Right side of head: No submental, submandibular, tonsillar, preauricular, posterior auricular or occipital adenopathy.     Left side of head: No submental, submandibular, tonsillar, preauricular, posterior auricular or occipital adenopathy.     Cervical: No cervical adenopathy.  Skin:    General: Skin is warm and dry.  Neurological:     Mental Status: She is alert.  Psychiatric:        Speech: Speech normal.        Behavior: Behavior normal.        Thought Content: Thought content normal.

## 2023-12-28 ENCOUNTER — Other Ambulatory Visit: Payer: Self-pay

## 2024-01-03 ENCOUNTER — Other Ambulatory Visit: Payer: Self-pay | Admitting: Family

## 2024-01-03 ENCOUNTER — Other Ambulatory Visit: Payer: Self-pay

## 2024-01-03 DIAGNOSIS — L4050 Arthropathic psoriasis, unspecified: Secondary | ICD-10-CM | POA: Diagnosis not present

## 2024-01-03 DIAGNOSIS — L400 Psoriasis vulgaris: Secondary | ICD-10-CM | POA: Diagnosis not present

## 2024-01-03 DIAGNOSIS — Z7960 Long term (current) use of unspecified immunomodulators and immunosuppressants: Secondary | ICD-10-CM | POA: Diagnosis not present

## 2024-01-03 MED ORDER — FOLIC ACID 1 MG PO TABS
1.0000 mg | ORAL_TABLET | Freq: Every day | ORAL | 3 refills | Status: AC
  Filled 2024-01-03: qty 90, 90d supply, fill #0
  Filled 2024-04-04: qty 90, 90d supply, fill #1

## 2024-01-04 ENCOUNTER — Other Ambulatory Visit: Payer: Self-pay

## 2024-01-04 MED FILL — Glucose Blood Test Strip: 50 days supply | Qty: 100 | Fill #0 | Status: AC

## 2024-01-05 ENCOUNTER — Other Ambulatory Visit: Payer: Self-pay

## 2024-01-07 ENCOUNTER — Other Ambulatory Visit: Payer: Self-pay

## 2024-01-07 NOTE — Progress Notes (Signed)
 Specialty Pharmacy Refill Coordination Note  Rachael Weeks is a 63 y.o. female contacted today regarding refills of specialty medication(s) Ixekizumab  (Taltz )   Patient requested Delivery   Delivery date: 01/11/24   Verified address: [address], [place] [address]   Medication will be filled on 01/10/24.

## 2024-01-10 ENCOUNTER — Other Ambulatory Visit: Payer: Self-pay

## 2024-01-29 ENCOUNTER — Other Ambulatory Visit: Payer: Self-pay | Admitting: Medical Genetics

## 2024-02-01 ENCOUNTER — Other Ambulatory Visit: Payer: Self-pay

## 2024-02-01 ENCOUNTER — Other Ambulatory Visit (HOSPITAL_COMMUNITY): Payer: Self-pay

## 2024-02-01 NOTE — Progress Notes (Signed)
 Specialty Pharmacy Refill Coordination Note  Spoke with Rachael Weeks is a 63 y.o. female contacted today regarding refills of specialty medication(s) Ixekizumab  (Taltz )  Doses on hand: 0  Injection date: 02/10/24   Patient requested: Delivery   Delivery date: 02/04/24   Verified address: [address] [place] [address]  Medication will be filled on 02/03/24.

## 2024-02-02 ENCOUNTER — Other Ambulatory Visit: Payer: Self-pay

## 2024-02-07 ENCOUNTER — Other Ambulatory Visit: Payer: Self-pay

## 2024-02-09 ENCOUNTER — Other Ambulatory Visit

## 2024-02-09 ENCOUNTER — Other Ambulatory Visit: Payer: Self-pay | Admitting: Family

## 2024-02-09 DIAGNOSIS — Z1231 Encounter for screening mammogram for malignant neoplasm of breast: Secondary | ICD-10-CM

## 2024-02-11 ENCOUNTER — Other Ambulatory Visit
Admission: RE | Admit: 2024-02-11 | Discharge: 2024-02-11 | Disposition: A | Discharge status: Home / Self Care | Payer: Self-pay | Source: Ambulatory Visit | Attending: Medical Genetics | Admitting: Medical Genetics

## 2024-02-21 LAB — GENECONNECT MOLECULAR SCREEN: Genetic Analysis Overall Interpretation: NEGATIVE

## 2024-02-23 ENCOUNTER — Other Ambulatory Visit: Payer: Self-pay

## 2024-02-24 ENCOUNTER — Other Ambulatory Visit: Payer: Self-pay

## 2024-02-28 ENCOUNTER — Other Ambulatory Visit: Payer: Self-pay

## 2024-02-28 NOTE — Progress Notes (Signed)
 Specialty Pharmacy Refill Coordination Note  Rachael Weeks is a 63 y.o. female contacted today regarding refills of specialty medication(s) Ixekizumab  (Taltz )   Patient requested Delivery   Delivery date: 03/02/24   Verified address: [address] [place]  [address]   Medication will be filled on 03/01/24.

## 2024-02-29 ENCOUNTER — Other Ambulatory Visit: Payer: Self-pay

## 2024-03-07 ENCOUNTER — Other Ambulatory Visit: Payer: Self-pay

## 2024-03-08 ENCOUNTER — Ambulatory Visit: Attending: Family | Admitting: Pharmacist

## 2024-03-08 ENCOUNTER — Other Ambulatory Visit: Payer: Self-pay

## 2024-03-08 DIAGNOSIS — Z7189 Other specified counseling: Secondary | ICD-10-CM

## 2024-03-08 NOTE — Progress Notes (Signed)
   S: Patient presents today for review of their specialty medication.   Patient is currently taking Taltz  for psoriasis, psoriatic arthritis. Patient is managed by Dr. Tobie for this.   Dosing: Plaque psoriasis: SubQ: 160 mg once, followed by 80 mg at weeks 2, 4, 6, 8, 10, and 12, and then 80 mg every 4 weeks. Psoriatic arthritis: SubQ: 160 mg once, followed by 80 mg every 4 weeks; may administer alone or in combination with conventional disease-modifying antirheumatic drugs (eg, methotrexate ). Note: For psoriatic arthritis patients with coexisting moderate to severe plaque psoriasis, use the dosing regimen for plaque psoriasis.  Adherence: confirms  Efficacy: reports it continues to work well for her so far.   Monitoring:  S/sx infection: none Injection site reactions: none S/sx of IBD: none S/sx of hypersensitivity reaction: none  Current adverse effects: none   O:     Lab Results  Component Value Date   WBC 4.2 10/12/2023   HGB 13.9 10/12/2023   HCT 40.5 10/12/2023   MCV 91.3 10/12/2023   PLT 188.0 10/12/2023      Chemistry      Component Value Date/Time   NA 138 10/12/2023 0943   NA 142 06/23/2022 1134   NA 138 12/12/2012 1315   K 4.0 10/12/2023 0943   K 3.6 12/12/2012 1315   CL 104 10/12/2023 0943   CL 102 12/12/2012 1315   CO2 30 10/12/2023 0943   CO2 30 12/12/2012 1315   BUN 9 10/12/2023 0943   BUN 9 06/23/2022 1134   BUN 9 12/12/2012 1315   CREATININE 0.53 10/12/2023 0943   CREATININE 0.45 (L) 05/09/2021 1611      Component Value Date/Time   CALCIUM 8.6 10/12/2023 0943   CALCIUM 8.8 12/12/2012 1315   ALKPHOS 56 10/12/2023 0943   AST 17 10/12/2023 0943   ALT 18 10/12/2023 0943   BILITOT 0.5 10/12/2023 0943       A/P: 1. Medication review: patient currently on Taltz  for psoriasis. Reviewed the medication with the patient, including the following: Taltz  is a medication used to treat ankylosing spondylitis, plaque psoriasis, and psoriatic  arthritis. Subcutaneous: Allow to reach room temperature prior to injection (30 minutes). Do not shake. Inject full amount into the upper arms, thighs or any quadrant of the abdomen; administer each injection at a different anatomic location than a previous injection and avoid areas where the skin is tender, bruised, erythematous, indurated, or affected by psoriasis. Administration in the upper, outer arm may be performed by a caregiver or health care provider. Ixekizumab  is intended for use under the guidance and supervision of a physician; may be self-injected by the patient following proper training in SubQ injection technique. Possible adverse effects include neutropenia, antibody development, increased risk of infection, injection site reaction, hypersensitivity reactions, and inflammatory bowel disease. Avoid live vaccinations. No recommendations for any changes at this time.  Herlene Fleeta Morris, PharmD, JAQUELINE, CPP Clinical Pharmacist [facility] [phone]

## 2024-03-22 ENCOUNTER — Other Ambulatory Visit: Payer: Self-pay

## 2024-03-22 ENCOUNTER — Ambulatory Visit: Admitting: Family

## 2024-03-24 ENCOUNTER — Other Ambulatory Visit: Payer: Self-pay

## 2024-03-24 NOTE — Progress Notes (Signed)
 Specialty Pharmacy Refill Coordination Note  Rachael Weeks is a 63 y.o. female contacted today regarding refills of specialty medication(s) Ixekizumab  (Taltz )   Patient requested Delivery   Delivery date: 03/31/24   Verified address: [address] [place] [address]   Medication will be filled on 03/30/24.

## 2024-03-30 ENCOUNTER — Other Ambulatory Visit: Payer: Self-pay

## 2024-03-31 ENCOUNTER — Ambulatory Visit: Admitting: Family

## 2024-04-04 ENCOUNTER — Other Ambulatory Visit: Payer: Self-pay

## 2024-04-04 ENCOUNTER — Encounter: Payer: Self-pay | Admitting: Family

## 2024-04-04 ENCOUNTER — Ambulatory Visit: Payer: Self-pay | Admitting: Family

## 2024-04-04 ENCOUNTER — Ambulatory Visit: Admitting: Family

## 2024-04-04 VITALS — BP 136/64 | HR 81 | Temp 98.3°F | Ht 61.0 in | Wt 113.6 lb

## 2024-04-04 DIAGNOSIS — R809 Proteinuria, unspecified: Secondary | ICD-10-CM | POA: Diagnosis not present

## 2024-04-04 DIAGNOSIS — R7309 Other abnormal glucose: Secondary | ICD-10-CM

## 2024-04-04 DIAGNOSIS — E1129 Type 2 diabetes mellitus with other diabetic kidney complication: Secondary | ICD-10-CM | POA: Diagnosis not present

## 2024-04-04 DIAGNOSIS — Z7984 Long term (current) use of oral hypoglycemic drugs: Secondary | ICD-10-CM | POA: Diagnosis not present

## 2024-04-04 DIAGNOSIS — D3501 Benign neoplasm of right adrenal gland: Secondary | ICD-10-CM

## 2024-04-04 LAB — BASIC METABOLIC PANEL WITH GFR
BUN: 4 mg/dL — ABNORMAL LOW (ref 6–23)
CO2: 29 meq/L (ref 19–32)
Calcium: 9 mg/dL (ref 8.4–10.5)
Chloride: 103 meq/L (ref 96–112)
Creatinine, Ser: 0.57 mg/dL (ref 0.40–1.20)
GFR: 97.07 mL/min (ref >60.00)
Glucose, Bld: 189 mg/dL — ABNORMAL HIGH (ref 70–99)
Potassium: 3.9 meq/L (ref 3.5–5.1)
Sodium: 139 meq/L (ref 135–145)

## 2024-04-04 LAB — POCT GLYCOSYLATED HEMOGLOBIN (HGB A1C): HbA1c POC (<> result, manual entry): 8.8 % (ref 4.0–5.6)

## 2024-04-04 MED ORDER — FREESTYLE FREEDOM LITE W/DEVICE KIT
1.0000 | PACK | Freq: Every day | 0 refills | Status: AC
  Filled 2024-04-04: qty 1, 1d supply, fill #0

## 2024-04-04 MED ORDER — OZEMPIC (0.25 OR 0.5 MG/DOSE) 2 MG/3ML ~~LOC~~ SOPN
0.2500 mg | PEN_INJECTOR | SUBCUTANEOUS | 2 refills | Status: AC
  Filled 2024-04-04: qty 3, 28d supply, fill #0
  Filled 2024-06-29: qty 3, 28d supply, fill #1

## 2024-04-04 MED FILL — Glucose Blood Test Strip: 50 days supply | Qty: 100 | Fill #1 | Status: AC

## 2024-04-04 NOTE — Patient Instructions (Addendum)
 Restart jardiance  25mg  and be very consistent.   Restart ozempic  0.25mg  once weekly once your appetite has returned.    Goal of fasting blood sugar is between 70-120. If in this range, we are reaching our target a1c ( goal 6.5%)   Please check fasting blood sugar in the morning time once or twice a week.  You may also check if you feel like you are having a low episode or particularly high episode of blood sugar. Please drop off readings in about 2 weeks.   If blood sugars increase, I may advise you to check blood sugar after your largest meal.  You specifically do this TWO hours after largest meal with the goal of being less than 180.  If blood sugar is checked sooner than 2 hours after largest meal, and it will be  expected to be elevated. You must wait 2 hours.   If your blood sugar is less than 180 hours after your largest meal, again we are reaching our target a1c goal   Call [facility] clinic if: BG < 70 or > 300.   If you have any symptoms of low blood sugar ( sweating, shakiness, lightheaded, dizzy) that you notify me. If you have a low, please drink a glass of orange juice and recheck blood sugar every 5 minutes until you don't feel symptomatic AND blood sugar is above 80.

## 2024-04-04 NOTE — Assessment & Plan Note (Signed)
 Chronic, suboptimal control.  Discussed recent work-related stress.  Discussed weight loss.  No gross symptoms of hyperglycemia.  Refill provided for glucometer.  Reiterated importance of remaining compliant with Jardiance  25 mg daily.  We opted to decrease Ozempic  from 1 mg to 0.25 mg and to resume once appetite has improved due to lapse in using medication  of late. advised patient to call me for if blood sugar less than 70 or greater than 250.  Provided blood sugar log and low glycemic diet sheet.  Patient will bring blood sugar log by the office in 1 to 2 weeks.

## 2024-04-04 NOTE — Progress Notes (Signed)
 Assessment & Plan:  Type 2 diabetes mellitus with diabetic microalbuminuria, without long-term current use of insulin  (HCC) Assessment & Plan: Chronic, suboptimal control.  Discussed recent work-related stress.  Discussed weight loss.  No gross symptoms of hyperglycemia.  Refill provided for glucometer.  Reiterated importance of remaining compliant with Jardiance  25 mg daily.  We opted to decrease Ozempic  from 1 mg to 0.25 mg and to resume once appetite has improved due to lapse in using medication  of late. advised patient to call me for if blood sugar less than 70 or greater than 250.  Provided blood sugar log and low glycemic diet sheet.  Patient will bring blood sugar log by the office in 1 to 2 weeks.  Orders: -     Basic metabolic panel with GFR -     Ozempic  (0.25 or 0.5 MG/DOSE); Inject 0.25 mg into the skin once a week.  Dispense: 3 mL; Refill: 2 -     FreeStyle Freedom Lite; 1 each by Does not apply route daily.  Dispense: 1 kit; Refill: 0  Elevated glucose -     POCT glycosylated hemoglobin (Hb A1C)  Adrenal adenoma, right     Return precautions given.   Risks, benefits, and alternatives of the medications and treatment plan prescribed today were discussed, and patient expressed understanding.   Education regarding symptom management and diagnosis given to patient on AVS either electronically or printed.  Return in about 3 months (around 07/05/2024).  Rollene Northern, FNP  Subjective:    Patient ID: Rachael Weeks, female    DOB: [birth year], 63 y.o.   MRN: [id number]  CC: Rachael Weeks is a 63 y.o. female who presents today for follow up.   HPI: HPI Discussed the use of AI scribe software for clinical note transcription with the patient, who gave verbal consent to proceed.  History of Present Illness   Rachael Weeks is a 63 year old female with diabetes who presents for a follow-up visit to discuss her diabetes management.  Her A1c has increased to 8.8, the highest  in two years, from a previous level of 8.2, with some readings in the high sevens. She has not been taking her medications, Jardiance  25 mg and Ozempic  1 mg, regularly. She has not taken the Ozempic  shot for at least two weeks and is unsure about her Jardiance  intake.  She has lost weight, currently weighing 113 pounds, down from 125 pounds in May, attributing this to stress and decreased appetite. She often forgets to eat until late in the evening.  She has not been checking her blood sugar levels regularly. Denies increased thirst, frequent urination, or blurry vision.  She has been experiencing significant stress due to recent changes in her employment. Five weeks ago, she was informed that her position was being terminated, leading to uncertainty regarding her financial situation. She recently learned she will receive six months of full pay and insurance coverage, providing some relief.     Lung cancer screening 10/19/2023 lung RADS 2, benign appearance or behavior.  Aortic atherosclerosis.  Stable right adrenal adenoma.  No follow-up imaging is recommended  Allergies: Patient has no known allergies. Current Outpatient Medications on File Prior to Visit  Medication Sig Dispense Refill   clobetasol  ointment (TEMOVATE ) 0.05 % Apply 1 Application topically 2 (two) times daily. 30 g 5   cyanocobalamin  (VITAMIN B12) 1000 MCG/ML injection Inject 1 mL (1,000 mcg total) into the muscle of the thigh every 30 (thirty) days.  3 mL 4   empagliflozin  (JARDIANCE ) 25 MG TABS tablet Take 1 tablet (25 mg total) by mouth daily before breakfast. 30 tablet 5   folic acid  (FOLVITE ) 1 MG tablet Take 1 tablet (1 mg total) by mouth daily. 90 tablet 3   folic acid  (FOLVITE ) 1 MG tablet Take 1 tablet (1 mg total) by mouth daily. 90 tablet 3   glucose blood (FREESTYLE LITE) test strip USED TO CHECK BLOOD GLUCOSE 2 TIMES A DAY. 100 each 5   ixekizumab  (TALTZ ) 80 MG/ML pen Inject 80 mg subcutaneously as directed 3 mL 3    Lancet Devices (B-D LANCET DEVICE) MISC Inject 1 application into the skin 4 (four) times daily. 100 each 3   Lancets (FREESTYLE) lancets USED TO CHECK BLOOD GLUCOSE LEVELS 2 TIMES A DAY. 100 each 5   methotrexate  (RHEUMATREX) 2.5 MG tablet Take 8 tablets (20 mg total) by mouth every 7 (seven) days All on the same day 32 tablet 5   NEEDLE, DISP, 25 G (BD ECLIPSE NEEDLE) 25G X 1 MISC Used to give monthly B-12 injections. 50 each 1   SYRINGE/NEEDLE, DISP, 1 ML (BD LUER-LOK SYRINGE) 25G X 5/8 1 ML MISC Used to give monthly B-12 injections. 50 each 1   tobramycin  (TOBREX ) 0.3 % ophthalmic solution Instill 1 drop in both eyes four times a day; Begin one day prior to procedure, use day of procedure, one day after then stop 5 mL 5   tobramycin  (TOBREX ) 0.3 % ophthalmic solution Instill 1 drop into both eyes four times a day; Begin one day prior to procedure, use day of procedure, one day after then stop 5 mL 2   No current facility-administered medications on file prior to visit.    Review of Systems  Constitutional:  Negative for chills and fever.  Respiratory:  Negative for cough.   Cardiovascular:  Negative for chest pain and palpitations.  Gastrointestinal:  Negative for nausea and vomiting.  Endocrine: Negative for polydipsia, polyphagia and polyuria.      Objective:    BP 136/64   Pulse 81   Temp 98.3 F (36.8 C) (Oral)   Ht 5' 1 (1.549 m)   Wt 113 lb 9.6 oz (51.5 kg)   SpO2 97%   BMI 21.46 kg/m  BP Readings from Last 3 Encounters:  04/04/24 136/64  12/23/23 132/62  10/12/23 122/80   Wt Readings from Last 3 Encounters:  04/04/24 113 lb 9.6 oz (51.5 kg)  12/23/23 119 lb 6.4 oz (54.2 kg)  10/12/23 125 lb 9.6 oz (57 kg)    Physical Exam Vitals reviewed.  Constitutional:      Appearance: She is well-developed.  Eyes:     Conjunctiva/sclera: Conjunctivae normal.  Cardiovascular:     Rate and Rhythm: Normal rate and regular rhythm.     Pulses: Normal pulses.     Heart  sounds: Normal heart sounds.  Pulmonary:     Effort: Pulmonary effort is normal.     Breath sounds: Normal breath sounds. No wheezing, rhonchi or rales.  Skin:    General: Skin is warm and dry.  Neurological:     Mental Status: She is alert.  Psychiatric:        Speech: Speech normal.        Behavior: Behavior normal.        Thought Content: Thought content normal.

## 2024-04-06 ENCOUNTER — Ambulatory Visit
Admission: RE | Admit: 2024-04-06 | Discharge: 2024-04-06 | Disposition: A | Discharge status: Home / Self Care | Source: Ambulatory Visit | Attending: Family | Admitting: Family

## 2024-04-06 DIAGNOSIS — Z1231 Encounter for screening mammogram for malignant neoplasm of breast: Secondary | ICD-10-CM | POA: Diagnosis not present

## 2024-04-12 ENCOUNTER — Other Ambulatory Visit: Payer: Self-pay

## 2024-04-19 ENCOUNTER — Other Ambulatory Visit (HOSPITAL_COMMUNITY): Payer: Self-pay

## 2024-04-21 ENCOUNTER — Other Ambulatory Visit: Payer: Self-pay

## 2024-04-21 NOTE — Progress Notes (Signed)
 Specialty Pharmacy Refill Coordination Note  Rachael Weeks is a 63 y.o. female contacted today regarding refills of specialty medication(s) Ixekizumab  (Taltz )   Patient requested (Patient-Rptd) Delivery   Delivery date: 05/04/24   Verified address: (Patient-Rptd) [address] [place] [address]   Medication will be filled on: 05/03/24

## 2024-05-03 ENCOUNTER — Other Ambulatory Visit: Payer: Self-pay

## 2024-05-04 ENCOUNTER — Other Ambulatory Visit: Payer: Self-pay

## 2024-05-04 MED ORDER — METHOTREXATE SODIUM 2.5 MG PO TABS
20.0000 mg | ORAL_TABLET | ORAL | 5 refills | Status: AC
  Filled 2024-05-04: qty 32, 28d supply, fill #0

## 2024-05-14 ENCOUNTER — Other Ambulatory Visit: Payer: Self-pay

## 2024-05-23 ENCOUNTER — Other Ambulatory Visit: Payer: Self-pay

## 2024-05-23 DIAGNOSIS — E113513 Type 2 diabetes mellitus with proliferative diabetic retinopathy with macular edema, bilateral: Secondary | ICD-10-CM | POA: Diagnosis not present

## 2024-05-23 MED ORDER — TOBRAMYCIN 0.3 % OP SOLN
1.0000 [drp] | Freq: Four times a day (QID) | OPHTHALMIC | 6 refills | Status: AC
  Filled 2024-05-23: qty 5, 25d supply, fill #0
  Filled 2024-06-29: qty 5, 12d supply, fill #0

## 2024-05-24 ENCOUNTER — Other Ambulatory Visit: Payer: Self-pay | Admitting: Pharmacy Technician

## 2024-05-24 ENCOUNTER — Encounter (INDEPENDENT_AMBULATORY_CARE_PROVIDER_SITE_OTHER): Payer: Self-pay

## 2024-05-24 ENCOUNTER — Other Ambulatory Visit: Payer: Self-pay

## 2024-05-24 NOTE — Progress Notes (Signed)
 Specialty Pharmacy Refill Coordination Note  Rachael Weeks is a 63 y.o. female contacted today regarding refills of specialty medication(s) Ixekizumab  (Taltz )   Patient requested Delivery   Delivery date: 05/31/24   Verified address: [address] [place] [address]   Medication will be filled on: 05/30/24

## 2024-05-30 ENCOUNTER — Other Ambulatory Visit: Payer: Self-pay

## 2024-06-06 ENCOUNTER — Other Ambulatory Visit: Payer: Self-pay

## 2024-06-20 ENCOUNTER — Other Ambulatory Visit (HOSPITAL_COMMUNITY): Payer: Self-pay

## 2024-06-20 ENCOUNTER — Other Ambulatory Visit: Payer: Self-pay

## 2024-06-22 ENCOUNTER — Other Ambulatory Visit (HOSPITAL_COMMUNITY): Payer: Self-pay

## 2024-06-27 ENCOUNTER — Other Ambulatory Visit: Payer: Self-pay

## 2024-06-27 ENCOUNTER — Other Ambulatory Visit: Payer: Self-pay | Admitting: Pharmacy Technician

## 2024-06-27 NOTE — Progress Notes (Signed)
 Specialty Pharmacy Refill Coordination Note  Rachael Weeks is a 64 y.o. female contacted today regarding refills of specialty medication(s) Ixekizumab  (Taltz )   Patient requested Delivery   Delivery date: 07/05/24   Verified address: [address]  [place]    Medication will be filled on: 07/04/24

## 2024-06-29 ENCOUNTER — Other Ambulatory Visit: Payer: Self-pay

## 2024-07-04 ENCOUNTER — Other Ambulatory Visit: Payer: Self-pay

## 2024-07-13 ENCOUNTER — Ambulatory Visit: Admitting: Family
